# Patient Record
Sex: Female | Born: 1980 | Hispanic: No | Marital: Married | Smoking: Never smoker
Health system: Southern US, Community
[De-identification: ages and names within clinical notes are randomized; demographics above are authoritative.]

## PROBLEM LIST (undated history)

## (undated) DIAGNOSIS — O24419 Gestational diabetes mellitus in pregnancy, unspecified control: Secondary | ICD-10-CM

---

## 2011-08-17 ENCOUNTER — Ambulatory Visit
Admission: RE | Admit: 2011-08-17 | Discharge: 2011-08-17 | Disposition: A | Discharge status: Home / Self Care | Payer: No Typology Code available for payment source | Source: Ambulatory Visit | Attending: Infectious Diseases | Admitting: Infectious Diseases

## 2011-08-17 ENCOUNTER — Other Ambulatory Visit: Payer: Self-pay | Admitting: Infectious Diseases

## 2011-08-17 DIAGNOSIS — R7611 Nonspecific reaction to tuberculin skin test without active tuberculosis: Secondary | ICD-10-CM

## 2012-10-15 ENCOUNTER — Encounter: Payer: Self-pay | Admitting: Obstetrics and Gynecology

## 2012-10-15 ENCOUNTER — Ambulatory Visit (INDEPENDENT_AMBULATORY_CARE_PROVIDER_SITE_OTHER): Payer: Medicaid Other | Admitting: Obstetrics and Gynecology

## 2012-10-15 VITALS — BP 113/75 | Temp 96.8°F | Ht 60.0 in | Wt 177.2 lb

## 2012-10-15 DIAGNOSIS — O0932 Supervision of pregnancy with insufficient antenatal care, second trimester: Secondary | ICD-10-CM

## 2012-10-15 DIAGNOSIS — O0930 Supervision of pregnancy with insufficient antenatal care, unspecified trimester: Secondary | ICD-10-CM

## 2012-10-15 LAB — POCT URINALYSIS DIP (DEVICE)
Glucose, UA: NEGATIVE mg/dL
Ketones, ur: NEGATIVE mg/dL
Specific Gravity, Urine: >=1.03 (ref 1.005–1.030)

## 2012-10-15 MED ORDER — PRENATAL VITAMINS 0.8 MG PO TABS: 1.0000 | ORAL_TABLET | Freq: Every day | ORAL | Status: DC

## 2012-10-15 NOTE — Addendum Note (Signed)
Addended by: Soyla Murphy T on: 10/15/2012 04:41 PM   Modules accepted: Orders

## 2012-10-15 NOTE — Progress Notes (Signed)
   Subjective:    Mariadelaluz Syniah Berne is a [id number] [redacted]w[redacted]d being seen today for her first obstetrical visit. Language line for interpretation [place]).  Her obstetrical history is significant for excessive weight gain and late care. Patient does intend to breast feed. Pregnancy history fully reviewed.  Patient reports no complaints.  Filed Vitals:   10/15/12 0851 10/15/12 0857  BP: 113/75   Temp: 96.8 F (36 C)   Height:  5' (1.524 m)  Weight: 177 lb 3.2 oz (80.377 kg)     HISTORY: OB History   Grav Para Term Preterm Abortions TAB SAB Ect Mult Living   6 4 4  0 1 0 1 0 0 4     # Outc Date GA Lbr Len/2nd Wgt Sex Del Anes PTL Lv   1 TRM 9/97    F SVD   Yes   2 TRM 8/02    M SVD      3 TRM 12/06    F SVD      4 TRM 8/08    F SVD      5 SAB            6 CUR              History reviewed. No pertinent past medical history. History reviewed. No pertinent past surgical history. History reviewed. No pertinent family history.   Exam    Uterus:     Pelvic Exam:    Perineum: Normal Perineum   Vulva: normal, Bartholin's, Urethra, Skene's normal   Vagina:  normal mucosa, normal discharge       Cervix: multiparous appearance   Adnexa: not evaluated   Bony Pelvis: gynecoid  System: Breast:  normal appearance, no masses or tenderness, Inspection negative   Skin: normal coloration and turgor, no rashes    Neurologic: oriented, normal, grossly non-focal   Extremities: normal strength, tone, and muscle mass, no deformities   HEENT PERRLA   Mouth/Teeth mucous membranes moist, pharynx normal without lesions   Neck supple and no masses   Cardiovascular: regular rate and rhythm, no murmurs or gallops   Respiratory:  appears well, vitals normal, no respiratory distress, acyanotic, normal RR, ear and throat exam is normal, neck free of mass or lymphadenopathy, chest clear, no wheezing, crepitations, rhonchi, normal symmetric air entry   Abdomen: S=D, NT FHR 150   Urinary: urethral  meatus normal      Assessment:    Pregnancy: [id number] Patient Active Problem List   Diagnosis Date Noted  . Prenatal care insufficient 10/15/2012        Plan:     Initial labs drawn. 1 hr OGTT done Prenatal vitamins. Problem list reviewed and updated. Genetic Screening discussed Quad Screen: requested.  Ultrasound discussed; fetal survey: requested.  Follow up in 4 weeks. 50% of 30 min visit spent on counseling and coordination of care.     Shanan Fitzpatrick 10/15/2012

## 2012-10-15 NOTE — Addendum Note (Signed)
Addended by: Toula Moos on: 10/15/2012 10:36 AM   Modules accepted: Orders

## 2012-10-15 NOTE — Addendum Note (Signed)
Addended by: Soyla Murphy T on: 10/15/2012 01:36 PM   Modules accepted: Orders

## 2012-10-15 NOTE — Patient Instructions (Addendum)
Pregnancy - Second Trimester The second trimester of pregnancy (3 to 6 months) is a period of rapid growth for you and your baby. At the end of the sixth month, your baby is about 9 inches long and weighs 1 1/2 pounds. You will begin to feel the baby move between 18 and 20 weeks of the pregnancy. This is called quickening. Weight gain is faster. A clear fluid (colostrum) may leak out of your breasts. You may feel small contractions of the womb (uterus). This is known as false labor or Braxton-Hicks contractions. This is like a practice for labor when the baby is ready to be born. Usually, the problems with morning sickness have usually passed by the end of your first trimester. Some women develop small dark blotches (called cholasma, mask of pregnancy) on their face that usually goes away after the baby is born. Exposure to the sun makes the blotches worse. Acne may also develop in some pregnant women and pregnant women who have acne, may find that it goes away. PRENATAL EXAMS  Blood work may continue to be done during prenatal exams. These tests are done to check on your health and the probable health of your baby. Blood work is used to follow your blood levels (hemoglobin). Anemia (low hemoglobin) is common during pregnancy. Iron and vitamins are given to help prevent this. You will also be checked for diabetes between 24 and 28 weeks of the pregnancy. Some of the previous blood tests may be repeated.  The size of the uterus is measured during each visit. This is to make sure that the baby is continuing to grow properly according to the dates of the pregnancy.  Your blood pressure is checked every prenatal visit. This is to make sure you are not getting toxemia.  Your urine is checked to make sure you do not have an infection, diabetes or protein in the urine.  Your weight is checked often to make sure gains are happening at the suggested rate. This is to ensure that both you and your baby are growing  normally.  Sometimes, an ultrasound is performed to confirm the proper growth and development of the baby. This is a test which bounces harmless sound waves off the baby so your caregiver can more accurately determine due dates. Sometimes, a specialized test is done on the amniotic fluid surrounding the baby. This test is called an amniocentesis. The amniotic fluid is obtained by sticking a needle into the belly (abdomen). This is done to check the chromosomes in instances where there is a concern about possible genetic problems with the baby. It is also sometimes done near the end of pregnancy if an early delivery is required. In this case, it is done to help make sure the baby's lungs are mature enough for the baby to live outside of the womb. CHANGES OCCURING IN THE SECOND TRIMESTER OF PREGNANCY Your body goes through many changes during pregnancy. They vary from person to person. Talk to your caregiver about changes you notice that you are concerned about.  During the second trimester, you will likely have an increase in your appetite. It is normal to have cravings for certain foods. This varies from person to person and pregnancy to pregnancy.  Your lower abdomen will begin to bulge.  You may have to urinate more often because the uterus and baby are pressing on your bladder. It is also common to get more bladder infections during pregnancy (pain with urination). You can help this by   drinking lots of fluids and emptying your bladder before and after intercourse.  You may begin to get stretch marks on your hips, abdomen, and breasts. These are normal changes in the body during pregnancy. There are no exercises or medications to take that prevent this change.  You may begin to develop swollen and bulging veins (varicose veins) in your legs. Wearing support hose, elevating your feet for 15 minutes, 3 to 4 times a day and limiting salt in your diet helps lessen the problem.  Heartburn may develop  as the uterus grows and pushes up against the stomach. Antacids recommended by your caregiver helps with this problem. Also, eating smaller meals 4 to 5 times a day helps.  Constipation can be treated with a stool softener or adding bulk to your diet. Drinking lots of fluids, vegetables, fruits, and whole grains are helpful.  Exercising is also helpful. If you have been very active up until your pregnancy, most of these activities can be continued during your pregnancy. If you have been less active, it is helpful to start an exercise program such as walking.  Hemorrhoids (varicose veins in the rectum) may develop at the end of the second trimester. Warm sitz baths and hemorrhoid cream recommended by your caregiver helps hemorrhoid problems.  Backaches may develop during this time of your pregnancy. Avoid heavy lifting, wear low heal shoes and practice good posture to help with backache problems.  Some pregnant women develop tingling and numbness of their hand and fingers because of swelling and tightening of ligaments in the wrist (carpel tunnel syndrome). This goes away after the baby is born.  As your breasts enlarge, you may have to get a bigger bra. Get a comfortable, cotton, support bra. Do not get a nursing bra until the last month of the pregnancy if you will be nursing the baby.  You may get a dark line from your belly button to the pubic area called the linea nigra.  You may develop rosy cheeks because of increase blood flow to the face.  You may develop spider looking lines of the face, neck, arms and chest. These go away after the baby is born. HOME CARE INSTRUCTIONS   It is extremely important to avoid all smoking, herbs, alcohol, and unprescribed drugs during your pregnancy. These chemicals affect the formation and growth of the baby. Avoid these chemicals throughout the pregnancy to ensure the delivery of a healthy infant.  Most of your home care instructions are the same as  suggested for the first trimester of your pregnancy. Keep your caregiver's appointments. Follow your caregiver's instructions regarding medication use, exercise and diet.  During pregnancy, you are providing food for you and your baby. Continue to eat regular, well-balanced meals. Choose foods such as meat, fish, milk and other low fat dairy products, vegetables, fruits, and whole-grain breads and cereals. Your caregiver will tell you of the ideal weight gain.  A physical sexual relationship may be continued up until near the end of pregnancy if there are no other problems. Problems could include early (premature) leaking of amniotic fluid from the membranes, vaginal bleeding, abdominal pain, or other medical or pregnancy problems.  Exercise regularly if there are no restrictions. Check with your caregiver if you are unsure of the safety of some of your exercises. The greatest weight gain will occur in the last 2 trimesters of pregnancy. Exercise will help you:  Control your weight.  Get you in shape for labor and delivery.  Lose weight   after you have the baby.  Wear a good support or jogging bra for breast tenderness during pregnancy. This may help if worn during sleep. Pads or tissues may be used in the bra if you are leaking colostrum.  Do not use hot tubs, steam rooms or saunas throughout the pregnancy.  Wear your seat belt at all times when driving. This protects you and your baby if you are in an accident.  Avoid raw meat, uncooked cheese, cat litter boxes and soil used by cats. These carry germs that can cause birth defects in the baby.  The second trimester is also a good time to visit your dentist for your dental health if this has not been done yet. Getting your teeth cleaned is OK. Use a soft toothbrush. Brush gently during pregnancy.  It is easier to loose urine during pregnancy. Tightening up and strengthening the pelvic muscles will help with this problem. Practice stopping your  urination while you are going to the bathroom. These are the same muscles you need to strengthen. It is also the muscles you would use as if you were trying to stop from passing gas. You can practice tightening these muscles up 10 times a set and repeating this about 3 times per day. Once you know what muscles to tighten up, do not perform these exercises during urination. It is more likely to contribute to an infection by backing up the urine.  Ask for help if you have financial, counseling or nutritional needs during pregnancy. Your caregiver will be able to offer counseling for these needs as well as refer you for other special needs.  Your skin may become oily. If so, wash your face with mild soap, use non-greasy moisturizer and oil or cream based makeup. MEDICATIONS AND DRUG USE IN PREGNANCY  Take prenatal vitamins as directed. The vitamin should contain 1 milligram of folic acid. Keep all vitamins out of reach of children. Only a couple vitamins or tablets containing iron may be fatal to a baby or young child when ingested.  Avoid use of all medications, including herbs, over-the-counter medications, not prescribed or suggested by your caregiver. Only take over-the-counter or prescription medicines for pain, discomfort, or fever as directed by your caregiver. Do not use aspirin.  Let your caregiver also know about herbs you may be using.  Alcohol is related to a number of birth defects. This includes fetal alcohol syndrome. All alcohol, in any form, should be avoided completely. Smoking will cause low birth rate and premature babies.  Street or illegal drugs are very harmful to the baby. They are absolutely forbidden. A baby born to an addicted mother will be addicted at birth. The baby will go through the same withdrawal an adult does. SEEK MEDICAL CARE IF:  You have any concerns or worries during your pregnancy. It is better to call with your questions if you feel they cannot wait, rather  than worry about them. SEEK IMMEDIATE MEDICAL CARE IF:   An unexplained oral temperature above 102 F (38.9 C) develops, or as your caregiver suggests.  You have leaking of fluid from the vagina (birth canal). If leaking membranes are suspected, take your temperature and tell your caregiver of this when you call.  There is vaginal spotting, bleeding, or passing clots. Tell your caregiver of the amount and how many pads are used. Light spotting in pregnancy is common, especially following intercourse.  You develop a bad smelling vaginal discharge with a change in the color from clear   to white.  You continue to feel sick to your stomach (nauseated) and have no relief from remedies suggested. You vomit blood or coffee ground-like materials.  You lose more than 2 pounds of weight or gain more than 2 pounds of weight over 1 week, or as suggested by your caregiver.  You notice swelling of your face, hands, feet, or legs.  You get exposed to German measles and have never had them.  You are exposed to fifth disease or chickenpox.  You develop belly (abdominal) pain. Round ligament discomfort is a common non-cancerous (benign) cause of abdominal pain in pregnancy. Your caregiver still must evaluate you.  You develop a bad headache that does not go away.  You develop fever, diarrhea, pain with urination, or shortness of breath.  You develop visual problems, blurry, or double vision.  You fall or are in a car accident or any kind of trauma.  There is mental or physical violence at home. Document Released: 05/15/2001 Document Revised: 08/13/2011 Document Reviewed: 11/17/2008 [employer] Patient Information 2013 [employer].  

## 2012-10-15 NOTE — Progress Notes (Signed)
Pulse- 91 Patient reports pelvic pain/pressure  

## 2012-10-16 ENCOUNTER — Telehealth: Payer: Self-pay

## 2012-10-16 LAB — GLUCOSE TOLERANCE, 1 HOUR (50G) W/O FASTING: Glucose, 1 Hour GTT: 101 mg/dL (ref 70–140)

## 2012-10-16 LAB — CULTURE, OB URINE

## 2012-10-16 MED ORDER — PRENATAL VITAMINS 0.8 MG PO TABS: 1.0000 | ORAL_TABLET | Freq: Every day | ORAL | Status: DC

## 2012-10-16 NOTE — Addendum Note (Signed)
Addended by: Caren Griffins C on: 10/16/2012 11:06 AM   Modules accepted: Orders

## 2012-10-16 NOTE — Telephone Encounter (Signed)
Used pacific interpreter to speak to patient.  She left her RX for prenatal vitamins at the clinic yesterday.  We were unable to e-prescribe because she did not have a pharmacy on file.  I contacted the patient and she was unsure of which pharmacy to use so she stated she would come to the clinic tomorrow to pick up her RX.

## 2012-10-17 ENCOUNTER — Other Ambulatory Visit: Payer: Self-pay

## 2012-10-17 DIAGNOSIS — O0932 Supervision of pregnancy with insufficient antenatal care, second trimester: Secondary | ICD-10-CM

## 2012-10-17 LAB — OBSTETRIC PANEL
Antibody Screen: NEGATIVE
Basophils Absolute: 0 10*3/uL (ref 0.0–0.1)
Basophils Relative: 0 % (ref 0–1)
Eosinophils Absolute: 0.4 10*3/uL (ref 0.0–0.7)
Hemoglobin: 12 g/dL (ref 12.0–15.0)
Hepatitis B Surface Ag: NEGATIVE
MCH: 28.7 pg (ref 26.0–34.0)
MCHC: 35.4 g/dL (ref 30.0–36.0)
Monocytes Relative: 5 % (ref 3–12)
Neutrophils Relative %: 66 % (ref 43–77)
Platelets: 177 10*3/uL (ref 150–400)
RDW: 14.7 % (ref 11.5–15.5)
Rh Type: POSITIVE

## 2012-10-17 LAB — HEMOGLOBINOPATHY EVALUATION
Hemoglobin Other: 0 %
Hgb A2 Quant: 3 % (ref 2.2–3.2)
Hgb A: 96.7 % — ABNORMAL LOW (ref 96.8–97.8)

## 2012-10-17 MED ORDER — PRENATAL VITAMINS 0.8 MG PO TABS: 1.0000 | ORAL_TABLET | Freq: Every day | ORAL | Status: DC

## 2012-10-23 ENCOUNTER — Encounter: Payer: Self-pay | Admitting: *Deleted

## 2012-10-29 ENCOUNTER — Ambulatory Visit (HOSPITAL_COMMUNITY): Admission: RE | Admit: 2012-10-29 | Payer: Self-pay | Source: Ambulatory Visit

## 2012-11-07 ENCOUNTER — Ambulatory Visit (HOSPITAL_COMMUNITY)
Admission: RE | Admit: 2012-11-07 | Discharge: 2012-11-07 | Disposition: A | Discharge status: Home / Self Care | Payer: Medicaid Other | Source: Ambulatory Visit | Attending: Obstetrics and Gynecology | Admitting: Obstetrics and Gynecology

## 2012-11-07 DIAGNOSIS — O0930 Supervision of pregnancy with insufficient antenatal care, unspecified trimester: Secondary | ICD-10-CM | POA: Insufficient documentation

## 2012-11-07 DIAGNOSIS — Z3689 Encounter for other specified antenatal screening: Secondary | ICD-10-CM | POA: Insufficient documentation

## 2012-11-07 DIAGNOSIS — O0932 Supervision of pregnancy with insufficient antenatal care, second trimester: Secondary | ICD-10-CM

## 2012-11-12 ENCOUNTER — Ambulatory Visit (INDEPENDENT_AMBULATORY_CARE_PROVIDER_SITE_OTHER): Payer: Medicaid Other | Admitting: Obstetrics and Gynecology

## 2012-11-12 ENCOUNTER — Encounter: Payer: Self-pay | Admitting: Obstetrics and Gynecology

## 2012-11-12 VITALS — BP 121/77 | Temp 96.9°F | Wt 178.4 lb

## 2012-11-12 DIAGNOSIS — IMO0002 Reserved for concepts with insufficient information to code with codable children: Secondary | ICD-10-CM

## 2012-11-12 DIAGNOSIS — O1202 Gestational edema, second trimester: Secondary | ICD-10-CM | POA: Insufficient documentation

## 2012-11-12 DIAGNOSIS — O0930 Supervision of pregnancy with insufficient antenatal care, unspecified trimester: Secondary | ICD-10-CM

## 2012-11-12 LAB — POCT URINALYSIS DIP (DEVICE)
Ketones, ur: NEGATIVE mg/dL
Leukocytes, UA: NEGATIVE
Nitrite: NEGATIVE
Protein, ur: 30 mg/dL — AB

## 2012-11-12 NOTE — Progress Notes (Signed)
Reviewed SVDs- largest 3k. [place] all nl. Trace pedal and ankle edema and measures to alleviate. RLP discussed.

## 2012-11-12 NOTE — Progress Notes (Signed)
Pulse- 100  Edema-feet

## 2012-11-12 NOTE — Patient Instructions (Addendum)
Peripheral Edema You have swelling in your legs (peripheral edema). This swelling is due to excess accumulation of salt and water in your body. Edema may be a sign of heart, kidney or liver disease, or a side effect of a medication. It may also be due to problems in the leg veins. Elevating your legs and using special support stockings may be very helpful, if the cause of the swelling is due to poor venous circulation. Avoid long periods of standing, whatever the cause. Treatment of edema depends on identifying the cause. Chips, pretzels, pickles and other salty foods should be avoided. Restricting salt in your diet is almost always needed. Water pills (diuretics) are often used to remove the excess salt and water from your body via urine. These medicines prevent the kidney from reabsorbing sodium. This increases urine flow. Diuretic treatment may also result in lowering of potassium levels in your body. Potassium supplements may be needed if you have to use diuretics daily. Daily weights can help you keep track of your progress in clearing your edema. You should call your caregiver for follow up care as recommended. SEEK IMMEDIATE MEDICAL CARE IF:   You have increased swelling, pain, redness, or heat in your legs.  You develop shortness of breath, especially when lying down.  You develop chest or abdominal pain, weakness, or fainting.  You have a fever. Document Released: 06/28/2004 Document Revised: 08/13/2011 Document Reviewed: 06/08/2009 [facility] Patient Information 2014 [place], [place]. Round Ligament Pain The round ligament is made up of muscle and fibrous tissue. It is attached to the uterus near the fallopian tube. The round ligament is located on both sides of the uterus and helps support the position of the uterus. It usually begins in the second trimester of pregnancy when the uterus comes out of the pelvis. The pain can come and go until the baby is delivered. Round ligament pain is not a  serious problem and does not cause harm to the baby. CAUSE During pregnancy the uterus grows the most from the second trimester to delivery. As it grows, it stretches and slightly twists the round ligaments. When the uterus leans from one side to the other, the round ligament on the opposite side pulls and stretches. This can cause pain. SYMPTOMS  Pain can occur on one side or both sides. The pain is usually a short, sharp, and pinching-like. Sometimes it can be a dull, lingering and aching pain. The pain is located in the lower side of the abdomen or in the groin. The pain is internal and usually starts deep in the groin and moves up to the outside of the hip area. Pain can occur with:  Sudden change in position like getting out of bed or a chair.  Rolling over in bed.  Coughing or sneezing.  Walking too much.  Any type of physical activity. DIAGNOSIS  Your caregiver will make sure there are no serious problems causing the pain. When nothing serious is found, the symptoms usually indicate that the pain is from the round ligament. TREATMENT   Sit down and relax when the pain starts.  Flex your knees up to your belly.  Lay on your side with a pillow under your belly (abdomen) and another one between your legs.  Sit in a hot bath for 15 to 20 minutes or until the pain goes away. HOME CARE INSTRUCTIONS   Only take over-the-counter or prescriptions medicines for pain, discomfort or fever as directed by your caregiver.  Sit and stand  slowly.  Avoid long walks if it causes pain.  Stop or lessen your physical activities if it causes pain. SEEK MEDICAL CARE IF:   The pain does not go away with any of your treatment.  You need stronger medication for the pain.  You develop back pain that you did not have before with the side pain. SEEK IMMEDIATE MEDICAL CARE IF:   You develop a temperature of 102 F (38.9 C) or higher.  You develop uterine contractions.  You develop vaginal  bleeding.  You develop nausea, vomiting or diarrhea.  You develop chills.  You have pain when you urinate. Document Released: 02/28/2008 Document Revised: 08/13/2011 Document Reviewed: 02/28/2008 [facility] Patient Information 2014 [place], [place].

## 2012-11-19 ENCOUNTER — Ambulatory Visit (HOSPITAL_COMMUNITY)
Admission: RE | Admit: 2012-11-19 | Discharge: 2012-11-19 | Disposition: A | Discharge status: Home / Self Care | Payer: Medicaid Other | Source: Ambulatory Visit | Attending: Obstetrics and Gynecology | Admitting: Obstetrics and Gynecology

## 2012-11-19 DIAGNOSIS — O0930 Supervision of pregnancy with insufficient antenatal care, unspecified trimester: Secondary | ICD-10-CM

## 2012-11-19 DIAGNOSIS — Z3689 Encounter for other specified antenatal screening: Secondary | ICD-10-CM | POA: Insufficient documentation

## 2012-11-19 DIAGNOSIS — O1202 Gestational edema, second trimester: Secondary | ICD-10-CM

## 2012-12-10 ENCOUNTER — Ambulatory Visit (INDEPENDENT_AMBULATORY_CARE_PROVIDER_SITE_OTHER): Payer: Medicaid Other | Admitting: Advanced Practice Midwife

## 2012-12-10 VITALS — BP 121/71 | Temp 97.6°F | Wt 179.9 lb

## 2012-12-10 DIAGNOSIS — O0930 Supervision of pregnancy with insufficient antenatal care, unspecified trimester: Secondary | ICD-10-CM

## 2012-12-10 LAB — POCT URINALYSIS DIP (DEVICE)
Glucose, UA: NEGATIVE mg/dL
Hgb urine dipstick: NEGATIVE
Nitrite: NEGATIVE
Protein, ur: NEGATIVE mg/dL
Specific Gravity, Urine: >=1.03 (ref 1.005–1.030)
Urobilinogen, UA: 1 mg/dL (ref 0.0–1.0)
pH: 5.5 (ref 5.0–8.0)

## 2012-12-10 NOTE — Progress Notes (Signed)
Pulse- 108 [employer] interpreter# [phone]

## 2012-12-10 NOTE — Progress Notes (Signed)
Patient is feeling well. No complaints or questions today. [place] used. GTT at next visit.

## 2013-01-07 ENCOUNTER — Ambulatory Visit (INDEPENDENT_AMBULATORY_CARE_PROVIDER_SITE_OTHER): Payer: Medicaid Other | Admitting: Advanced Practice Midwife

## 2013-01-07 VITALS — BP 124/72 | Temp 97.4°F | Wt 182.2 lb

## 2013-01-07 DIAGNOSIS — Z23 Encounter for immunization: Secondary | ICD-10-CM

## 2013-01-07 DIAGNOSIS — O0930 Supervision of pregnancy with insufficient antenatal care, unspecified trimester: Secondary | ICD-10-CM

## 2013-01-07 DIAGNOSIS — Z3490 Encounter for supervision of normal pregnancy, unspecified, unspecified trimester: Secondary | ICD-10-CM

## 2013-01-07 DIAGNOSIS — O0932 Supervision of pregnancy with insufficient antenatal care, second trimester: Secondary | ICD-10-CM

## 2013-01-07 LAB — CBC
HCT: 34.4 % — ABNORMAL LOW (ref 36.0–46.0)
Hemoglobin: 12.2 g/dL (ref 12.0–15.0)
MCH: 28.2 pg (ref 26.0–34.0)
MCHC: 35.5 g/dL (ref 30.0–36.0)
MCV: 79.6 fL (ref 78.0–100.0)
RDW: 13.7 % (ref 11.5–15.5)

## 2013-01-07 LAB — POCT URINALYSIS DIP (DEVICE)
Hgb urine dipstick: NEGATIVE
Leukocytes, UA: NEGATIVE
Nitrite: NEGATIVE
Protein, ur: NEGATIVE mg/dL
Urobilinogen, UA: 0.2 mg/dL (ref 0.0–1.0)
pH: 6 (ref 5.0–8.0)

## 2013-01-07 MED ORDER — TETANUS-DIPHTH-ACELL PERTUSSIS 5-2.5-18.5 LF-MCG/0.5 IM SUSP
0.5000 mL | Freq: Once | INTRAMUSCULAR | Status: AC
  Administered 2013-01-07: 0.5 mL via INTRAMUSCULAR

## 2013-01-07 NOTE — Patient Instructions (Addendum)

## 2013-01-07 NOTE — Addendum Note (Signed)
Addended by: Faythe Casa on: 01/07/2013 10:56 AM   Modules accepted: Orders

## 2013-01-07 NOTE — Addendum Note (Signed)
Addended by: Aviva Signs on: 01/07/2013 12:51 PM   Modules accepted: Orders

## 2013-01-07 NOTE — Addendum Note (Signed)
Addended by: Franchot Mimes on: 01/07/2013 10:14 AM   Modules accepted: Orders

## 2013-01-07 NOTE — Progress Notes (Signed)
P=102 . Used [employer] [phone],. States yesterday felt very hungry, ate eggs, then had diarrhea, vomiting , sweating and cramping.States none today. States has never had problem when ate eggs before.

## 2013-01-07 NOTE — Progress Notes (Signed)
Interpretor used.  Feels well, but had one day with nausea and diarrhea, now gone. Has never had problem with eggs before, doubt this is an egg allergy.  Agrees to vaccine which I think is safe.  Glucola and labs today.

## 2013-01-08 LAB — GLUCOSE TOLERANCE, 1 HOUR (50G) W/O FASTING: Glucose, 1 Hour GTT: 136 mg/dL (ref 70–140)

## 2013-01-12 ENCOUNTER — Encounter: Payer: Self-pay | Admitting: *Deleted

## 2013-01-20 ENCOUNTER — Telehealth: Payer: Self-pay | Admitting: General Practice

## 2013-01-20 NOTE — Telephone Encounter (Signed)
Called patient with pacific interpreter # [phone], no answer- left message stating we are trying to get in touch with her with some information and to please come for her appt tomorrow at 8am and to not eat or drink anything after midnight.

## 2013-01-20 NOTE — Telephone Encounter (Signed)
Message copied by Kathee Delton on Tue Jan 20, 2013  3:23 PM ------      Message from: [place], [place] L      Created: Thu Jan 15, 2013 11:24 AM       Needs to do 3 hr GTT ------

## 2013-01-21 ENCOUNTER — Encounter: Payer: Self-pay | Admitting: Obstetrics and Gynecology

## 2013-01-21 ENCOUNTER — Ambulatory Visit (INDEPENDENT_AMBULATORY_CARE_PROVIDER_SITE_OTHER): Payer: Medicaid Other | Admitting: Obstetrics and Gynecology

## 2013-01-21 VITALS — BP 111/75 | Temp 97.3°F | Wt 185.1 lb

## 2013-01-21 DIAGNOSIS — O0932 Supervision of pregnancy with insufficient antenatal care, second trimester: Secondary | ICD-10-CM

## 2013-01-21 DIAGNOSIS — O99810 Abnormal glucose complicating pregnancy: Secondary | ICD-10-CM

## 2013-01-21 DIAGNOSIS — O24419 Gestational diabetes mellitus in pregnancy, unspecified control: Secondary | ICD-10-CM

## 2013-01-21 DIAGNOSIS — O0930 Supervision of pregnancy with insufficient antenatal care, unspecified trimester: Secondary | ICD-10-CM

## 2013-01-21 DIAGNOSIS — O0933 Supervision of pregnancy with insufficient antenatal care, third trimester: Secondary | ICD-10-CM

## 2013-01-21 LAB — POCT URINALYSIS DIP (DEVICE)
Bilirubin Urine: NEGATIVE
Hgb urine dipstick: NEGATIVE
Leukocytes, UA: NEGATIVE
Nitrite: NEGATIVE
Protein, ur: NEGATIVE mg/dL
pH: 5.5 (ref 5.0–8.0)

## 2013-01-21 LAB — GLUCOSE TOLERANCE, 3 HOURS
Glucose Tolerance, 2 hour: 194 mg/dL — ABNORMAL HIGH (ref 70–164)
Glucose, GTT - 3 Hour: 103 mg/dL (ref 70–144)

## 2013-01-21 MED ORDER — PRENATAL VITAMINS 0.8 MG PO TABS: 1.0000 | ORAL_TABLET | Freq: Every day | ORAL | Status: DC

## 2013-01-21 NOTE — Addendum Note (Signed)
Addended by: Franchot Mimes on: 01/21/2013 09:43 AM   Modules accepted: Orders

## 2013-01-21 NOTE — Patient Instructions (Signed)
Contraception Choices  Contraception (birth control) is the use of any methods or devices to prevent pregnancy. Below are some methods to help avoid pregnancy.  HORMONAL METHODS   · Contraceptive implant. This is a thin, plastic tube containing progesterone hormone. It does not contain estrogen hormone. Your caregiver inserts the tube in the inner part of the upper arm. The tube can remain in place for up to 3 years. After 3 years, the implant must be removed. The implant prevents the ovaries from releasing an egg (ovulation), thickens the cervical mucus which prevents sperm from entering the uterus, and thins the lining of the inside of the uterus.  · Progesterone-only injections. These injections are given every 3 months by your caregiver to prevent pregnancy. This synthetic progesterone hormone stops the ovaries from releasing eggs. It also thickens cervical mucus and changes the uterine lining. This makes it harder for sperm to survive in the uterus.  · Birth control pills. These pills contain estrogen and progesterone hormone. They work by stopping the egg from forming in the ovary (ovulation). Birth control pills are prescribed by a caregiver. Birth control pills can also be used to treat heavy periods.  · Minipill. This type of birth control pill contains only the progesterone hormone. They are taken every day of each month and must be prescribed by your caregiver.  · Birth control patch. The patch contains hormones similar to those in birth control pills. It must be changed once a week and is prescribed by a caregiver.  · Vaginal ring. The ring contains hormones similar to those in birth control pills. It is left in the vagina for 3 weeks, removed for 1 week, and then a new one is put back in place. The patient must be comfortable inserting and removing the ring from the vagina. A caregiver's prescription is necessary.  · Emergency contraception. Emergency contraceptives prevent pregnancy after unprotected  sexual intercourse. This pill can be taken right after sex or up to 5 days after unprotected sex. It is most effective the sooner you take the pills after having sexual intercourse. Emergency contraceptive pills are available without a prescription. Check with your pharmacist. Do not use emergency contraception as your only form of birth control.  BARRIER METHODS   · Female condom. This is a thin sheath (latex or rubber) that is worn over the penis during sexual intercourse. It can be used with spermicide to increase effectiveness.  · Female condom. This is a soft, loose-fitting sheath that is put into the vagina before sexual intercourse.  · Diaphragm. This is a soft, latex, dome-shaped barrier that must be fitted by a caregiver. It is inserted into the vagina, along with a spermicidal jelly. It is inserted before intercourse. The diaphragm should be left in the vagina for 6 to 8 hours after intercourse.  · Cervical cap. This is a round, soft, latex or plastic cup that fits over the cervix and must be fitted by a caregiver. The cap can be left in place for up to 48 hours after intercourse.  · Sponge. This is a soft, circular piece of polyurethane foam. The sponge has spermicide in it. It is inserted into the vagina after wetting it and before sexual intercourse.  · Spermicides. These are chemicals that kill or block sperm from entering the cervix and uterus. They come in the form of creams, jellies, suppositories, foam, or tablets. They do not require a prescription. They are inserted into the vagina with an applicator before having sexual intercourse.   IUD). This is a T-shaped device that is put in a woman's uterus during a menstrual period to prevent pregnancy. There are 2 types:  Copper IUD. This type of IUD is wrapped in copper wire and is placed inside the uterus. Copper makes the uterus and  fallopian tubes produce a fluid that kills sperm. It can stay in place for 10 years.  Hormone IUD. This type of IUD contains the hormone progestin (synthetic progesterone). The hormone thickens the cervical mucus and prevents sperm from entering the uterus, and it also thins the uterine lining to prevent implantation of a fertilized egg. The hormone can weaken or kill the sperm that get into the uterus. It can stay in place for 5 years. PERMANENT METHODS OF CONTRACEPTION  Female tubal ligation. This is when the woman's fallopian tubes are surgically sealed, tied, or blocked to prevent the egg from traveling to the uterus.  Female sterilization. This is when the female has the tubes that carry sperm tied off (vasectomy).This blocks sperm from entering the vagina during sexual intercourse. After the procedure, the man can still ejaculate fluid (semen). NATURAL PLANNING METHODS  Natural family planning. This is not having sexual intercourse or using a barrier method (condom, diaphragm, cervical cap) on days the woman could become pregnant.  Calendar method. This is keeping track of the length of each menstrual cycle and identifying when you are fertile.  Ovulation method. This is avoiding sexual intercourse during ovulation.  Symptothermal method. This is avoiding sexual intercourse during ovulation, using a thermometer and ovulation symptoms.  Post-ovulation method. This is timing sexual intercourse after you have ovulated. Regardless of which type or method of contraception you choose, it is important that you use condoms to protect against the transmission of sexually transmitted diseases (STDs). Talk with your caregiver about which form of contraception is most appropriate for you. Document Released: 05/21/2005 Document Revised: 08/13/2011 Document Reviewed: 09/27/2010 [facility] Patient Information 2014 [place], [place]. Heartburn Heartburn is a painful, burning sensation in the chest. It may feel  worse in certain positions, such as lying down or bending over. It is caused by stomach acid backing up into the tube that carries food from the mouth down to the stomach (lower esophagus).  CAUSES   Large meals.  Certain foods and drinks.  Exercise.  Increased acid production.  Being overweight or obese.  Certain medicines. SYMPTOMS   Burning pain in the chest or lower throat.  Bitter taste in the mouth.  Coughing. DIAGNOSIS  If the usual treatments for heartburn do not improve your symptoms, then tests may be done to see if there is another condition present. Possible tests may include:  X-rays.  Endoscopy. This is when a tube with a light and a camera on the end is used to examine the esophagus and the stomach.  A test to measure the amount of acid in the esophagus (pH test).  A test to see if the esophagus is working properly (esophageal manometry).  Blood, breath, or stool tests to check for bacteria that cause ulcers. TREATMENT   Your caregiver may tell you to use certain over-the-counter medicines (antacids, acid reducers) for mild heartburn.  Your caregiver may prescribe medicines to decrease the acid in your stomach or protect your stomach lining.  Your caregiver may recommend certain diet changes.  For severe cases, your caregiver may recommend that the head of your bed be elevated on blocks. (Sleeping with more pillows is not an effective treatment as it only changes the  position of your head and does not improve the main problem of stomach acid refluxing into the esophagus.) HOME CARE INSTRUCTIONS   Take all medicines as directed by your caregiver.  Raise the head of your bed by putting blocks under the legs if instructed to by your caregiver.  Do not exercise right after eating.  Avoid eating 2 or 3 hours before bed. Do not lie down right after eating.  Eat small meals throughout the day instead of 3 large meals.  Stop smoking if you  smoke.  Maintain a healthy weight.  Identify foods and beverages that make your symptoms worse and avoid them. Foods you may want to avoid include:  Peppers.  Chocolate.  High-fat foods, including fried foods.  Spicy foods.  Garlic and onions.  Citrus fruits, including oranges, grapefruit, lemons, and limes.  Food containing tomatoes or tomato products.  Mint.  Carbonated drinks, caffeinated drinks, and alcohol.  Vinegar. SEEK IMMEDIATE MEDICAL CARE IF:  You have severe chest pain that goes down your arm or into your jaw or neck.  You feel sweaty, dizzy, or lightheaded.  You are short of breath.  You vomit blood.  You have difficulty or pain with swallowing.  You have bloody or black, tarry stools.  You have episodes of heartburn more than 3 times a week for more than 2 weeks. MAKE SURE YOU:  Understand these instructions.  Will watch your condition.  Will get help right away if you are not doing well or get worse. Document Released: 10/07/2008 Document Revised: 08/13/2011 Document Reviewed: 11/05/2010 [facility] Patient Information 2014 [place], [place].

## 2013-01-21 NOTE — Progress Notes (Signed)
PNV refilled. Discussed plans and info on contraception, circumcision given. 3 hr OGTT today. UA normal except SG 1.030. "Crqamping" is epigastric burning after eating. Denies UCs. Abd NT. Murphy's neg. Heartburn measures reviewed.

## 2013-01-21 NOTE — Progress Notes (Signed)
Pulse- 104  Patient needs refill on PNV Patient still reporting cramping like her period, nausea and vomiting with eating, states she gets really sweaty- reports constipation

## 2013-01-22 DIAGNOSIS — O24419 Gestational diabetes mellitus in pregnancy, unspecified control: Secondary | ICD-10-CM | POA: Insufficient documentation

## 2013-01-30 ENCOUNTER — Telehealth: Payer: Self-pay | Admitting: *Deleted

## 2013-01-30 NOTE — Telephone Encounter (Signed)
Message copied by Gerome Apley on Fri Jan 30, 2013 12:03 PM ------      Message from: POE, DEIRDRE C      Created: Thu Jan 22, 2013  8:30 AM       Pleasse schedule for Monday for GDM new dx ------

## 2013-02-04 ENCOUNTER — Ambulatory Visit (INDEPENDENT_AMBULATORY_CARE_PROVIDER_SITE_OTHER): Payer: Medicaid Other | Admitting: Advanced Practice Midwife

## 2013-02-04 VITALS — BP 114/73 | Temp 97.0°F | Wt 188.2 lb

## 2013-02-04 DIAGNOSIS — O99810 Abnormal glucose complicating pregnancy: Secondary | ICD-10-CM

## 2013-02-04 DIAGNOSIS — O24419 Gestational diabetes mellitus in pregnancy, unspecified control: Secondary | ICD-10-CM

## 2013-02-04 LAB — POCT URINALYSIS DIP (DEVICE)
Bilirubin Urine: NEGATIVE
Ketones, ur: NEGATIVE mg/dL
Leukocytes, UA: NEGATIVE
Specific Gravity, Urine: >=1.03 (ref 1.005–1.030)
pH: 5.5 (ref 5.0–8.0)

## 2013-02-04 MED ORDER — ACCU-CHEK NANO SMARTVIEW W/DEVICE KIT: 1.0000 | PACK | Status: DC

## 2013-02-04 MED ORDER — GLUCOSE BLOOD VI STRP: ORAL_STRIP | Status: DC

## 2013-02-04 MED ORDER — ACCU-CHEK FASTCLIX LANCETS MISC: 1.0000 | Freq: Four times a day (QID) | Status: DC

## 2013-02-04 NOTE — Progress Notes (Signed)
Doing well.  Good fetal movement, denies vaginal bleeding, LOF, regular contractions.  Using language line, discussed new diagnosis of gestational diabetes, need for diabetic education. Pt states understanding.  She will come in to see nutritionist this Thursday and bring new meter (prescription given to pt) to appointment Monday to see diabetic educator.

## 2013-02-04 NOTE — Progress Notes (Signed)
P=104, Used [employer] [phone], is not doing cbg's. Needs meter.c/o feeling tired.

## 2013-02-05 ENCOUNTER — Ambulatory Visit: Payer: Medicaid Other | Admitting: *Deleted

## 2013-02-05 VITALS — Wt 187.1 lb

## 2013-02-05 DIAGNOSIS — O99810 Abnormal glucose complicating pregnancy: Secondary | ICD-10-CM

## 2013-02-05 NOTE — Progress Notes (Signed)
Nutrition note: GDM diet education Pt is a newly diagnosed GDM pt and has h/o obesity. Pt has gained 34.1# @ [redacted]w[redacted]d, which is > expected. Used [employer] [phone] for Amharic. Pt reports eating 5x/d and drinks mostly water and milk with juice occ. Pt is taking PNV. Pt reports nausea occ. NKFA. Pt is walking occ. Pt received verbal & written education on GDM diet.  Discussed wt gain goals of 11-20# or 0.5#/wk. Pt agrees to follow GDM diet with 3 meals & 3 snacks/d with proper CHO/ protein combination.  Pt has WIC and plans to BF. F/u 02/09/13 when here to see DM educator about how to use meter Blondell Reveal, MS, RD, LDN

## 2013-02-09 ENCOUNTER — Ambulatory Visit (INDEPENDENT_AMBULATORY_CARE_PROVIDER_SITE_OTHER): Payer: Medicaid Other | Admitting: Obstetrics & Gynecology

## 2013-02-09 ENCOUNTER — Encounter: Payer: Medicaid Other | Attending: Obstetrics and Gynecology | Admitting: *Deleted

## 2013-02-09 VITALS — BP 111/71 | Temp 98.1°F | Wt 187.8 lb

## 2013-02-09 DIAGNOSIS — O99810 Abnormal glucose complicating pregnancy: Secondary | ICD-10-CM | POA: Insufficient documentation

## 2013-02-09 DIAGNOSIS — Z713 Dietary counseling and surveillance: Secondary | ICD-10-CM | POA: Insufficient documentation

## 2013-02-09 DIAGNOSIS — O24419 Gestational diabetes mellitus in pregnancy, unspecified control: Secondary | ICD-10-CM

## 2013-02-09 DIAGNOSIS — Z758 Other problems related to medical facilities and other health care: Secondary | ICD-10-CM | POA: Insufficient documentation

## 2013-02-09 DIAGNOSIS — O0930 Supervision of pregnancy with insufficient antenatal care, unspecified trimester: Secondary | ICD-10-CM

## 2013-02-09 DIAGNOSIS — Z609 Problem related to social environment, unspecified: Secondary | ICD-10-CM

## 2013-02-09 DIAGNOSIS — O3663X1 Maternal care for excessive fetal growth, third trimester, fetus 1: Secondary | ICD-10-CM

## 2013-02-09 DIAGNOSIS — O3660X0 Maternal care for excessive fetal growth, unspecified trimester, not applicable or unspecified: Secondary | ICD-10-CM

## 2013-02-09 DIAGNOSIS — O0933 Supervision of pregnancy with insufficient antenatal care, third trimester: Secondary | ICD-10-CM

## 2013-02-09 DIAGNOSIS — Z789 Other specified health status: Secondary | ICD-10-CM | POA: Insufficient documentation

## 2013-02-09 LAB — POCT URINALYSIS DIP (DEVICE)
Glucose, UA: NEGATIVE mg/dL
Hgb urine dipstick: NEGATIVE
Ketones, ur: NEGATIVE mg/dL
Specific Gravity, Urine: >=1.03 (ref 1.005–1.030)

## 2013-02-09 NOTE — Progress Notes (Signed)
Amharic phone interpreter used.  Not checking CBGs yet, will get GDM education today, evaluate in one week. Large for dates, new diagnosis of GDM, ultrasound ordered.   No other complaints or concerns.  Fetal movement and labor precautions reviewed.

## 2013-02-09 NOTE — Progress Notes (Signed)
Pulse- 102 [employer] interpreter # [phone]

## 2013-02-09 NOTE — Progress Notes (Signed)
Requested to instruct patient on how to use her glucose meter.  She has the accucheck nano that she purchased from drug store).  Instructed pt on how to load the cartridge fastclix into the lancet device and how to use it to get a blood sample. Explained necessity of keeping the strips in the container with top on tight.  Instructed how to stick finger using cleansing technique with either soap and water or alcohol, then how to insert the strip and draw up the blood.  Pt demonstrated technique properly, all using a phone to phone interpreter. RD reviewed meal plan, snacks, and monitoring schedule. Instructed to write her glucose results down in log book. Thank you, Lenor Coffin, RN, Diabetes CNS [phone])

## 2013-02-09 NOTE — Progress Notes (Signed)
Nutrition note: f/u re GDM diet Pt returned to learn how to check BS. Reviewed diet with pt.  Pt stated she has been trying to follow GDM diet and had no questions. F/u in 2-4 wks Blondell Reveal, MS, RD, LDN

## 2013-02-09 NOTE — Progress Notes (Signed)
U/S scheduled 02/11/13 at 1015 am.

## 2013-02-11 ENCOUNTER — Ambulatory Visit (HOSPITAL_COMMUNITY)
Admission: RE | Admit: 2013-02-11 | Discharge: 2013-02-11 | Disposition: A | Discharge status: Home / Self Care | Payer: Medicaid Other | Source: Ambulatory Visit | Attending: Obstetrics & Gynecology | Admitting: Obstetrics & Gynecology

## 2013-02-11 ENCOUNTER — Other Ambulatory Visit: Payer: Self-pay | Admitting: Obstetrics & Gynecology

## 2013-02-11 DIAGNOSIS — O3663X1 Maternal care for excessive fetal growth, third trimester, fetus 1: Secondary | ICD-10-CM

## 2013-02-11 DIAGNOSIS — O99810 Abnormal glucose complicating pregnancy: Secondary | ICD-10-CM | POA: Insufficient documentation

## 2013-02-11 DIAGNOSIS — O24419 Gestational diabetes mellitus in pregnancy, unspecified control: Secondary | ICD-10-CM

## 2013-02-11 DIAGNOSIS — O0930 Supervision of pregnancy with insufficient antenatal care, unspecified trimester: Secondary | ICD-10-CM | POA: Insufficient documentation

## 2013-02-11 DIAGNOSIS — O3660X0 Maternal care for excessive fetal growth, unspecified trimester, not applicable or unspecified: Secondary | ICD-10-CM | POA: Insufficient documentation

## 2013-02-12 ENCOUNTER — Encounter: Payer: Self-pay | Admitting: Obstetrics & Gynecology

## 2013-02-12 DIAGNOSIS — O3660X0 Maternal care for excessive fetal growth, unspecified trimester, not applicable or unspecified: Secondary | ICD-10-CM | POA: Insufficient documentation

## 2013-02-16 ENCOUNTER — Ambulatory Visit (INDEPENDENT_AMBULATORY_CARE_PROVIDER_SITE_OTHER): Payer: Medicaid Other | Admitting: Obstetrics & Gynecology

## 2013-02-16 VITALS — BP 107/67 | Temp 97.7°F | Wt 184.8 lb

## 2013-02-16 DIAGNOSIS — O3660X0 Maternal care for excessive fetal growth, unspecified trimester, not applicable or unspecified: Secondary | ICD-10-CM

## 2013-02-16 DIAGNOSIS — O99810 Abnormal glucose complicating pregnancy: Secondary | ICD-10-CM

## 2013-02-16 DIAGNOSIS — Z789 Other specified health status: Secondary | ICD-10-CM

## 2013-02-16 DIAGNOSIS — Z609 Problem related to social environment, unspecified: Secondary | ICD-10-CM

## 2013-02-16 LAB — POCT URINALYSIS DIP (DEVICE)
Glucose, UA: NEGATIVE mg/dL
Hgb urine dipstick: NEGATIVE
Ketones, ur: NEGATIVE mg/dL
Specific Gravity, Urine: >=1.03 (ref 1.005–1.030)

## 2013-02-16 LAB — GLUCOSE, CAPILLARY: Glucose-Capillary: 81 mg/dL (ref 70–99)

## 2013-02-16 NOTE — Progress Notes (Signed)
Fasting--pt not checking.  Pt not checking CBGS consistently.  Pt did not understand how to record CBGs.  Spent a lot of time with phone interpreter to explain when to check and where to write the value.  Today CBG is 81.  Pt ate 5 1/2 hours ago.   Overall, CBGs are not high so under reasonable control with diet.  Pt to record CBGs and return in one week.

## 2013-02-16 NOTE — Progress Notes (Signed)
Pulse- 98 [employer] interpreter # [phone]

## 2013-02-17 ENCOUNTER — Encounter: Payer: Self-pay | Admitting: *Deleted

## 2013-02-23 ENCOUNTER — Ambulatory Visit (INDEPENDENT_AMBULATORY_CARE_PROVIDER_SITE_OTHER): Payer: Medicaid Other | Admitting: Obstetrics & Gynecology

## 2013-02-23 VITALS — BP 126/73 | Temp 97.6°F | Wt 187.5 lb

## 2013-02-23 DIAGNOSIS — O0930 Supervision of pregnancy with insufficient antenatal care, unspecified trimester: Secondary | ICD-10-CM

## 2013-02-23 DIAGNOSIS — O0933 Supervision of pregnancy with insufficient antenatal care, third trimester: Secondary | ICD-10-CM

## 2013-02-23 LAB — POCT URINALYSIS DIP (DEVICE)
Bilirubin Urine: NEGATIVE
Glucose, UA: NEGATIVE mg/dL
Hgb urine dipstick: NEGATIVE
Ketones, ur: NEGATIVE mg/dL
Specific Gravity, Urine: >=1.03 (ref 1.005–1.030)

## 2013-02-23 MED ORDER — PRENATAL VITAMINS PLUS 27-1 MG PO TABS: 1.0000 | ORAL_TABLET | Freq: Every day | ORAL | Status: DC

## 2013-02-23 NOTE — Progress Notes (Signed)
FBS 76-105, most <100. Good FM.

## 2013-02-23 NOTE — Patient Instructions (Signed)
Pregnancy - Third Trimester  The third trimester begins at the 28th week of pregnancy and ends at birth. It is important to follow your doctor's instructions.  HOME CARE    Go to your doctor's visits.   Do not smoke.   Do not drink alcohol or use drugs.   Only take medicine as told by your doctor.   Take prenatal vitamins as told. The vitamin should contain 1 milligram of folic acid.   Exercise.   Eat healthy foods. Eat regular, well-balanced meals.   You can have sex (intercourse) if there are no other problems with the pregnancy.   Do not use hot tubs, steam rooms, or saunas.   Wear a seat belt while driving.   Avoid raw meat, uncooked cheese, and litter boxes and soil used by cats.   Rest with your legs raised (elevated).   Make a list of emergency phone numbers. Keep this list with you.   Arrange for help when you come back home after delivering the baby.   Make a trial run to the hospital.   Take prenatal classes.   Prepare the baby's nursery.   Do not travel out of the city. If you absolutely have to, get permission from your doctor first.   Wear flat shoes. Do not wear high heels.  GET HELP RIGHT AWAY IF:    You have a temperature by mouth above 102 F (38.9 C), not controlled by medicine.   You have not felt the baby move for more than 1 hour. If you think the baby is not moving as much as normal, eat something with sugar in it or lie down on your left side for an hour. The baby should move at least 4 to 5 times per hour.   Fluid is coming from the vagina.   Blood is coming from the vagina. Light spotting is common, especially after sex (intercourse).   You have belly (abdominal) pain.   You have a bad smelling fluid (discharge) coming from the vagina. The fluid changes from clear to white.   You still feel sick to your stomach (nauseous).   You throw up (vomit) for more than 24 hours.   You have the chills.   You have shortness of breath.   You have a burning feeling when you  pee (urinate).   You lose or gain more than 2 pounds (0.9 kilograms) of weight over a week, or as told by your doctor.   Your face, hands, feet, or legs get puffy (swell).   You have a bad headache that will not go away.   You start to have problems seeing (blurry or double vision).   You fall, are in a car accident, or have any kind of trauma.   There is mental or physical violence at home.   You have any concerns or worries during your pregnancy.  MAKE SURE YOU:    Understand these instructions.   Will watch your condition.   Will get help right away if you are not doing well or get worse.  Document Released: 08/15/2009 Document Revised: 08/13/2011 Document Reviewed: 08/15/2009  [employer] Patient Information 2014 [employer].

## 2013-02-23 NOTE — Progress Notes (Signed)
Pulse- 91  Edema- hands/feet [employer] interpreter # [phone]

## 2013-03-02 ENCOUNTER — Ambulatory Visit (INDEPENDENT_AMBULATORY_CARE_PROVIDER_SITE_OTHER): Payer: Medicaid Other | Admitting: Advanced Practice Midwife

## 2013-03-02 ENCOUNTER — Ambulatory Visit: Payer: Medicaid Other | Admitting: *Deleted

## 2013-03-02 ENCOUNTER — Encounter: Payer: Medicaid Other | Admitting: *Deleted

## 2013-03-02 VITALS — BP 117/72 | Temp 97.6°F | Wt 186.6 lb

## 2013-03-02 DIAGNOSIS — O3663X0 Maternal care for excessive fetal growth, third trimester, not applicable or unspecified: Secondary | ICD-10-CM

## 2013-03-02 DIAGNOSIS — O3660X0 Maternal care for excessive fetal growth, unspecified trimester, not applicable or unspecified: Secondary | ICD-10-CM

## 2013-03-02 LAB — OB RESULTS CONSOLE GC/CHLAMYDIA: Gonorrhea: NEGATIVE

## 2013-03-02 LAB — OB RESULTS CONSOLE GBS: GBS: NEGATIVE

## 2013-03-02 LAB — POCT URINALYSIS DIP (DEVICE)
Bilirubin Urine: NEGATIVE
Hgb urine dipstick: NEGATIVE
Ketones, ur: NEGATIVE mg/dL
Leukocytes, UA: NEGATIVE
Specific Gravity, Urine: >=1.03 (ref 1.005–1.030)
pH: 6 (ref 5.0–8.0)

## 2013-03-02 MED ORDER — GLUCOSE BLOOD VI STRP: ORAL_STRIP | Status: DC

## 2013-03-02 NOTE — Progress Notes (Signed)
Doing well.  Good fetal movement, denies vaginal bleeding, LOF, regular contractions.  Had meter but cannot afford test strips ($90?) so has not been testing sugars.  Will see diabetic educator today, look at orders to assist pt to obtain materials. CBG today. GBS/GCC collected.

## 2013-03-02 NOTE — Progress Notes (Signed)
DIABETES Education Through the assistance of [employer]...ibuprofen confirmed that she does in fact have Medicaid. She does not know the pharmacy that she uses. We provided her with a hard copy RX for glucose testing strips. I noted on the RX that pt had been able to obtain glucometer and lancets, however she was quoted a co-pay of $90.00. If they had any questions to call the clinic. Directed to test 4X daily to include FBS, 2hpp breakfast, lunch and dinner.

## 2013-03-02 NOTE — Progress Notes (Signed)
P=97,   Used [employer]. Has not gotten meter and is not checking blood sugar, can't afford copay.

## 2013-03-03 LAB — GC/CHLAMYDIA PROBE AMP: GC Probe RNA: NEGATIVE

## 2013-03-05 LAB — CULTURE, BETA STREP (GROUP B ONLY)

## 2013-03-09 ENCOUNTER — Ambulatory Visit (INDEPENDENT_AMBULATORY_CARE_PROVIDER_SITE_OTHER): Payer: Medicaid Other | Admitting: Obstetrics and Gynecology

## 2013-03-09 ENCOUNTER — Encounter: Payer: Self-pay | Admitting: Obstetrics and Gynecology

## 2013-03-09 VITALS — BP 104/61 | Temp 97.0°F | Wt 189.1 lb

## 2013-03-09 DIAGNOSIS — O3660X0 Maternal care for excessive fetal growth, unspecified trimester, not applicable or unspecified: Secondary | ICD-10-CM

## 2013-03-09 DIAGNOSIS — O24419 Gestational diabetes mellitus in pregnancy, unspecified control: Secondary | ICD-10-CM

## 2013-03-09 DIAGNOSIS — O0930 Supervision of pregnancy with insufficient antenatal care, unspecified trimester: Secondary | ICD-10-CM

## 2013-03-09 DIAGNOSIS — Z609 Problem related to social environment, unspecified: Secondary | ICD-10-CM

## 2013-03-09 DIAGNOSIS — Z789 Other specified health status: Secondary | ICD-10-CM

## 2013-03-09 DIAGNOSIS — O99810 Abnormal glucose complicating pregnancy: Secondary | ICD-10-CM

## 2013-03-09 DIAGNOSIS — Z603 Acculturation difficulty: Secondary | ICD-10-CM

## 2013-03-09 DIAGNOSIS — O0933 Supervision of pregnancy with insufficient antenatal care, third trimester: Secondary | ICD-10-CM

## 2013-03-09 DIAGNOSIS — O3663X1 Maternal care for excessive fetal growth, third trimester, fetus 1: Secondary | ICD-10-CM

## 2013-03-09 LAB — POCT URINALYSIS DIP (DEVICE)
Bilirubin Urine: NEGATIVE
Glucose, UA: NEGATIVE mg/dL
Ketones, ur: NEGATIVE mg/dL
Leukocytes, UA: NEGATIVE
Protein, ur: NEGATIVE mg/dL

## 2013-03-09 NOTE — Progress Notes (Signed)
Patient doing well without complaints. CBG's majority within range a few postprandial in 130's. Patient admits to not following diet that day. F/u growth ultrasound ordered. FM/labor precautions reviewed

## 2013-03-09 NOTE — Progress Notes (Signed)
P= 96 [employer] interpreter. Pt. Reports not being able to sleep well and is requesting medication to help her sleep.

## 2013-03-11 ENCOUNTER — Ambulatory Visit (HOSPITAL_COMMUNITY): Admission: RE | Admit: 2013-03-11 | Payer: Medicaid Other | Source: Ambulatory Visit

## 2013-03-11 ENCOUNTER — Ambulatory Visit (HOSPITAL_COMMUNITY)
Admission: RE | Admit: 2013-03-11 | Discharge: 2013-03-11 | Disposition: A | Discharge status: Home / Self Care | Payer: Medicaid Other | Source: Ambulatory Visit | Attending: Obstetrics and Gynecology | Admitting: Obstetrics and Gynecology

## 2013-03-11 DIAGNOSIS — O24419 Gestational diabetes mellitus in pregnancy, unspecified control: Secondary | ICD-10-CM

## 2013-03-11 DIAGNOSIS — O0933 Supervision of pregnancy with insufficient antenatal care, third trimester: Secondary | ICD-10-CM

## 2013-03-11 DIAGNOSIS — O99810 Abnormal glucose complicating pregnancy: Secondary | ICD-10-CM | POA: Insufficient documentation

## 2013-03-11 DIAGNOSIS — O0930 Supervision of pregnancy with insufficient antenatal care, unspecified trimester: Secondary | ICD-10-CM | POA: Insufficient documentation

## 2013-03-16 ENCOUNTER — Other Ambulatory Visit: Payer: Self-pay | Admitting: *Deleted

## 2013-03-16 ENCOUNTER — Encounter: Payer: Medicaid Other | Admitting: Obstetrics and Gynecology

## 2013-03-16 ENCOUNTER — Ambulatory Visit (INDEPENDENT_AMBULATORY_CARE_PROVIDER_SITE_OTHER): Payer: Medicaid Other | Admitting: Obstetrics and Gynecology

## 2013-03-16 VITALS — BP 110/70 | Temp 97.7°F | Wt 188.4 lb

## 2013-03-16 DIAGNOSIS — O24419 Gestational diabetes mellitus in pregnancy, unspecified control: Secondary | ICD-10-CM

## 2013-03-16 DIAGNOSIS — O99810 Abnormal glucose complicating pregnancy: Secondary | ICD-10-CM

## 2013-03-16 DIAGNOSIS — O3660X0 Maternal care for excessive fetal growth, unspecified trimester, not applicable or unspecified: Secondary | ICD-10-CM | POA: Insufficient documentation

## 2013-03-16 DIAGNOSIS — O3660X1 Maternal care for excessive fetal growth, unspecified trimester, fetus 1: Secondary | ICD-10-CM

## 2013-03-16 LAB — POCT URINALYSIS DIP (DEVICE)
Glucose, UA: NEGATIVE mg/dL
Specific Gravity, Urine: >=1.03 (ref 1.005–1.030)
Urobilinogen, UA: 0.2 mg/dL (ref 0.0–1.0)

## 2013-03-16 MED ORDER — GLUCOSE BLOOD VI STRP: ORAL_STRIP | Status: DC

## 2013-03-16 NOTE — Patient Instructions (Signed)

## 2013-03-16 NOTE — Progress Notes (Signed)
Pulse- 81  Pressure- " pushing down" [employer] interpreter # [phone]

## 2013-03-16 NOTE — Telephone Encounter (Signed)
Patient and her support person came to window stating they need a refill for test strips sent to pharmacy- refill sent

## 2013-03-16 NOTE — Progress Notes (Signed)
No teststrips x 2 d. Rx given. Log book reviewed: F 83-118 (4/7 over 90) but states eating in the middle of the night when highs. , PPs within range. LGA on 10/8 with nl AFI.  NST today.

## 2013-03-17 ENCOUNTER — Telehealth: Payer: Self-pay | Admitting: *Deleted

## 2013-03-17 NOTE — Telephone Encounter (Signed)
Pt came to clinic earlier today and stated that the pharmacy would not fill her Rx for blood glucose test strips. I called and spoke with the pharmacist at [facility] [phone] and after discussion was able to get the problem resolved.  Pt may pisck up Rx later today.  I called pt with [employer] interpreter # [phone] and spoke w/pt's husband who was not at home at the time. He stated that they had tried 4 times to get the Rx but were unable. I assured him that he will be able to pick up today after 4:30 pm.  I also stated that Tonetta needs another appt @ clinic this week on Thursday (for NST). He agreed to the time of 10:30 am. He voiced understanding of all information and instructions.

## 2013-03-19 ENCOUNTER — Ambulatory Visit (INDEPENDENT_AMBULATORY_CARE_PROVIDER_SITE_OTHER): Payer: Medicaid Other | Admitting: *Deleted

## 2013-03-19 ENCOUNTER — Encounter (HOSPITAL_COMMUNITY): Payer: Self-pay

## 2013-03-19 ENCOUNTER — Inpatient Hospital Stay (HOSPITAL_COMMUNITY)
Admission: RE | Admit: 2013-03-19 | Discharge: 2013-03-24 | DRG: 766 | Disposition: A | Discharge status: Home / Self Care | Payer: Medicaid Other | Source: Ambulatory Visit | Attending: Obstetrics and Gynecology | Admitting: Obstetrics and Gynecology

## 2013-03-19 VITALS — BP 105/71 | HR 94 | Temp 98.5°F | Resp 17 | Ht 61.0 in | Wt 188.0 lb

## 2013-03-19 DIAGNOSIS — O339 Maternal care for disproportion, unspecified: Secondary | ICD-10-CM | POA: Diagnosis present

## 2013-03-19 DIAGNOSIS — O99814 Abnormal glucose complicating childbirth: Principal | ICD-10-CM | POA: Diagnosis present

## 2013-03-19 DIAGNOSIS — O1202 Gestational edema, second trimester: Secondary | ICD-10-CM

## 2013-03-19 DIAGNOSIS — O24419 Gestational diabetes mellitus in pregnancy, unspecified control: Secondary | ICD-10-CM

## 2013-03-19 DIAGNOSIS — O3660X0 Maternal care for excessive fetal growth, unspecified trimester, not applicable or unspecified: Secondary | ICD-10-CM

## 2013-03-19 DIAGNOSIS — O330 Maternal care for disproportion due to deformity of maternal pelvic bones: Secondary | ICD-10-CM | POA: Diagnosis present

## 2013-03-19 DIAGNOSIS — O99810 Abnormal glucose complicating pregnancy: Secondary | ICD-10-CM

## 2013-03-19 DIAGNOSIS — Z98891 History of uterine scar from previous surgery: Secondary | ICD-10-CM

## 2013-03-19 DIAGNOSIS — O3663X1 Maternal care for excessive fetal growth, third trimester, fetus 1: Secondary | ICD-10-CM

## 2013-03-19 HISTORY — DX: Gestational diabetes mellitus in pregnancy, unspecified control: O24.419

## 2013-03-19 LAB — CBC
HCT: 31.8 % — ABNORMAL LOW (ref 36.0–46.0)
Hemoglobin: 11.4 g/dL — ABNORMAL LOW (ref 12.0–15.0)
MCH: 26.3 pg (ref 26.0–34.0)
MCV: 73.4 fL — ABNORMAL LOW (ref 78.0–100.0)
Platelets: 191 10*3/uL (ref 150–400)
RBC: 4.33 MIL/uL (ref 3.87–5.11)
RDW: 14.5 % (ref 11.5–15.5)
WBC: 9.5 10*3/uL (ref 4.0–10.5)

## 2013-03-19 LAB — GLUCOSE, CAPILLARY: Glucose-Capillary: 88 mg/dL (ref 70–99)

## 2013-03-19 MED ORDER — LACTATED RINGERS IV SOLN
INTRAVENOUS | Status: DC
  Administered 2013-03-19 – 2013-03-20 (×3): via INTRAVENOUS

## 2013-03-19 MED ORDER — OXYTOCIN BOLUS FROM INFUSION: 500.0000 mL | INTRAVENOUS | Status: DC

## 2013-03-19 MED ORDER — ACETAMINOPHEN 325 MG PO TABS: 650.0000 mg | ORAL_TABLET | ORAL | Status: DC | PRN

## 2013-03-19 MED ORDER — CITRIC ACID-SODIUM CITRATE 334-500 MG/5ML PO SOLN
30.0000 mL | ORAL | Status: DC | PRN
  Filled 2013-03-19 (×2): qty 15

## 2013-03-19 MED ORDER — ONDANSETRON HCL 4 MG/2ML IJ SOLN: 4.0000 mg | Freq: Four times a day (QID) | INTRAMUSCULAR | Status: DC | PRN

## 2013-03-19 MED ORDER — BUTORPHANOL TARTRATE 1 MG/ML IJ SOLN
1.0000 mg | INTRAMUSCULAR | Status: DC | PRN
  Administered 2013-03-20 (×3): 1 mg via INTRAVENOUS
  Filled 2013-03-19 (×3): qty 1

## 2013-03-19 MED ORDER — IBUPROFEN 600 MG PO TABS: 600.0000 mg | ORAL_TABLET | Freq: Four times a day (QID) | ORAL | Status: DC | PRN

## 2013-03-19 MED ORDER — OXYCODONE-ACETAMINOPHEN 5-325 MG PO TABS: 1.0000 | ORAL_TABLET | ORAL | Status: DC | PRN

## 2013-03-19 MED ORDER — FLEET ENEMA 7-19 GM/118ML RE ENEM: 1.0000 | ENEMA | Freq: Every day | RECTAL | Status: DC | PRN

## 2013-03-19 MED ORDER — OXYTOCIN 40 UNITS IN LACTATED RINGERS INFUSION - SIMPLE MED
62.5000 mL/h | INTRAVENOUS | Status: DC
  Filled 2013-03-19: qty 1000

## 2013-03-19 MED ORDER — LACTATED RINGERS IV SOLN
500.0000 mL | INTRAVENOUS | Status: DC | PRN
  Administered 2013-03-19 – 2013-03-20 (×3): 500 mL via INTRAVENOUS

## 2013-03-19 MED ORDER — MISOPROSTOL 25 MCG QUARTER TABLET
25.0000 ug | ORAL_TABLET | ORAL | Status: DC | PRN
  Administered 2013-03-19 – 2013-03-20 (×3): 25 ug via VAGINAL
  Filled 2013-03-19 (×3): qty 0.25

## 2013-03-19 NOTE — Progress Notes (Signed)
Review of [place] from 10/8 indicates recommendation from Dr. Claudean Severance for delivery @ 39 wks due to poorly controlled gestational diabetes and macrosomia.  Telephone consult with Dr. Macon Large who agrees with delivery recommendation.  IOL scheduled this evening at 1930.  Plan of care discussed using [employer] interpreter # [phone] via telephone. Pt's husband was also conferenced in to the call. Pt was given complete information for reason for IOL. She and her husband voiced understanding and all questions were answered to their satisfaction. Total telephone call time = 30 min.

## 2013-03-19 NOTE — H&P (Signed)
Obstetric History and Physical  Donna Knapp is a 32 y.o. [id number] with IUP at [redacted]w[redacted]d presenting for IOL for poorly controlled A1 GDM. She was evaluated by MFM on 03/11/13; ultrasound showed EFW 4215 g (9 lb 5 oz)/>90%tile with AC >97%tile,  AFI 12.2 cm. The recommendation was for antenatal testing and delivery at 39 weeks. She has reactive NSTs, no other abnormal findings.  Patient is Amharic-speaking, Tax inspector interpreter used for this encounter.  Patient states she has been having rare contractions, no vaginal bleeding, intact membranes, with active fetal movement.    Prenatal Course Source of Care: [facility] with onset of care at 17 weeks Pregnancy complications or risks: Patient Active Problem List   Diagnosis Date Noted  . LGA (large for gestational age) fetus affecting mother, antepartum 03/16/2013  . Large for dates complicating pregnancy, antepartum 02/12/2013  . Language barrier, speaks Amharic ([place]) only 02/09/2013  . Gestational diabetes mellitus, antepartum 01/22/2013  . Gestational edema in second trimester 11/12/2012  . Prenatal care insufficient 10/15/2012   She plans to breastfeed. Desires circumcision for her female infant. She is undecided about postpartum contraception.   Prenatal labs and studies: ABO, Rh: A/POS/-- (05/14 1014) Antibody: NEG (05/14 1014) Rubella: 5.04 (05/14 1014) RPR: NON REAC (08/06 1014)  HBsAg: NEGATIVE (05/14 1014)  HIV: NON REACTIVE (08/06 1014)  NGE:XBMWUXLK (09/29 0000) 1 hr Glucola 136; 3 hr GTT 94,201,194,103 Genetic screening : Too late Anatomy US normal  Prenatal Transfer Tool  Maternal Diabetes: Yes:  Diabetes Type:  Diet controlled Genetic Screening: Declined Maternal Ultrasounds/Referrals: Abnormal:  Findings:   Other:Macrosomia Fetal Ultrasounds or other Referrals:  None Maternal Substance Abuse:  No Significant Maternal Medications:  None Significant Maternal Lab Results: Lab values include: Group B Strep negative  No  past medical history on file.  No past surgical history on file.  OB History  Gravida Para Term Preterm AB SAB TAB Ectopic Multiple Living  6 4 4  0 1 1 0 0 0 4    # Outcome Date GA Lbr Len/2nd Weight Sex Delivery Anes PTL Lv  6 CUR           5 TRM 02/01/07    F SVD     4 TRM 05/16/05    F SVD     3 TRM 01/21/01    M SVD     2 TRM 02/05/96    F SVD   Y  1 SAB               History   Social History  . Marital Status: Married    Spouse Name: N/A    Number of Children: N/A  . Years of Education: N/A   Social History Main Topics  . Smoking status: Never Smoker   . Smokeless tobacco: Never Used  . Alcohol Use: No  . Drug Use: No  . Sexual Activity: Yes   Other Topics Concern  . Not on file   Social History Narrative  . No narrative on file    No family history on file.  Prescriptions prior to admission  Medication Sig Dispense Refill  . ACCU-CHEK FASTCLIX LANCETS MISC 1 Device by Does not apply route 4 (four) times daily.  102 each  2  . Blood Glucose Monitoring Suppl (ACCU-CHEK NANO SMARTVIEW) W/DEVICE KIT 1 Device by Does not apply route as directed. Check fasting, and two hours after breakfast, lunch and dinner  1 kit  0  . glucose blood (ACCU-CHEK SMARTVIEW) test strip  Use as instructed to check blood sugars  100 each  12  . Prenatal Vit-Fe Fumarate-FA (PRENATAL VITAMINS PLUS) 27-1 MG TABS Take 1 tablet by mouth daily.  30 tablet  2    No Known Allergies  Review of Systems: Negative except for what is mentioned in HPI.  Physical Exam: BP 120/71  Pulse 106  Temp(Src) 98.4 F (36.9 C) (Oral)  Resp 20  Ht 5\' 1"  (1.549 m)  Wt 188 lb (85.276 kg)  BMI 35.54 kg/m2  LMP 06/18/2012 GENERAL: Well-developed, well-nourished female in no acute distress.  LUNGS: Clear to auscultation bilaterally.  HEART: Regular rate and rhythm. ABDOMEN: Soft, nontender, nondistended, gravid. EXTREMITIES: Nontender, no edema, 2+ distal pulses. Cervical Exam:  0/thick/high/vertex FHT:  Baseline rate 140 bpm   Variability moderate  Accelerations present   Decelerations none Contractions: Every 5-9  mins   Pertinent Labs/Studies:   No results found for this or any previous visit (from the past 24 hour(s)).  Assessment : Donna Knapp is a 32 y.o. [id number] at [redacted]w[redacted]d being admitted for induction of labor for A1GDM, with concern about macrosomia.  Plan: Labor: Expectant management. Induction of labor will start with cytotec, proceed to foley bulb, pitocin, AROM as indicated. GDM: CBGs every 4 hours, diabetic diet FWB: Reassuring fetal heart tracing.  GBS negative Delivery plan: Hopeful for vaginal delivery but MFM recommends low threshold for cesarean delivery for failure to dilate or failure to descend, and avoidance of operative delivery given concern about macrosomia, increased AC in the 97%tile which could lead to should dystocia.  Jaynie Collins, MD, FACOG Attending Obstetrician & Gynecologist Faculty Practice, [facility] [place]

## 2013-03-19 NOTE — Progress Notes (Signed)
NST performed today was reviewed and was found to be reactive.  Will proceed with IOL today as scheduled for poorly controlled GDM as recommended by MFM.

## 2013-03-20 ENCOUNTER — Encounter (HOSPITAL_COMMUNITY): Payer: Medicaid Other | Admitting: Anesthesiology

## 2013-03-20 ENCOUNTER — Inpatient Hospital Stay (HOSPITAL_COMMUNITY): Payer: Medicaid Other | Admitting: Anesthesiology

## 2013-03-20 ENCOUNTER — Encounter (HOSPITAL_COMMUNITY): Payer: Self-pay

## 2013-03-20 LAB — GLUCOSE, CAPILLARY
Glucose-Capillary: 95 mg/dL (ref 70–99)
Glucose-Capillary: 75 mg/dL (ref 70–99)
Glucose-Capillary: 72 mg/dL (ref 70–99)
Glucose-Capillary: 76 mg/dL (ref 70–99)
Glucose-Capillary: 79 mg/dL (ref 70–99)

## 2013-03-20 LAB — HEMOGLOBIN A1C
Hgb A1c MFr Bld: 5 %
Mean Plasma Glucose: 97 mg/dL

## 2013-03-20 LAB — ABO/RH: ABO/RH(D): A POS

## 2013-03-20 MED ORDER — LIDOCAINE HCL (PF) 1 % IJ SOLN
INTRAMUSCULAR | Status: AC
  Filled 2013-03-20: qty 30

## 2013-03-20 MED ORDER — OXYTOCIN 40 UNITS IN LACTATED RINGERS INFUSION - SIMPLE MED
1.0000 m[IU]/min | INTRAVENOUS | Status: DC
  Administered 2013-03-20: 2 m[IU]/min via INTRAVENOUS
  Filled 2013-03-20: qty 1000

## 2013-03-20 MED ORDER — FENTANYL 2.5 MCG/ML BUPIVACAINE 1/10 % EPIDURAL INFUSION (WH - ANES)
14.0000 mL/h | INTRAMUSCULAR | Status: DC | PRN
  Filled 2013-03-20: qty 125

## 2013-03-20 MED ORDER — LIDOCAINE HCL (PF) 1 % IJ SOLN
INTRAMUSCULAR | Status: DC | PRN
  Administered 2013-03-20 (×2): 4 mL

## 2013-03-20 MED ORDER — PHENYLEPHRINE 40 MCG/ML (10ML) SYRINGE FOR IV PUSH (FOR BLOOD PRESSURE SUPPORT)
80.0000 ug | PREFILLED_SYRINGE | INTRAVENOUS | Status: DC | PRN
  Filled 2013-03-20: qty 5

## 2013-03-20 MED ORDER — FENTANYL 2.5 MCG/ML BUPIVACAINE 1/10 % EPIDURAL INFUSION (WH - ANES)
INTRAMUSCULAR | Status: DC | PRN
  Administered 2013-03-20: 12 mL/h via EPIDURAL

## 2013-03-20 MED ORDER — TERBUTALINE SULFATE 1 MG/ML IJ SOLN: 0.2500 mg | Freq: Once | INTRAMUSCULAR | Status: AC | PRN

## 2013-03-20 MED ORDER — LACTATED RINGERS IV SOLN
500.0000 mL | Freq: Once | INTRAVENOUS | Status: AC
  Administered 2013-03-20: 500 mL via INTRAVENOUS

## 2013-03-20 MED ORDER — EPHEDRINE 5 MG/ML INJ: 10.0000 mg | INTRAVENOUS | Status: DC | PRN

## 2013-03-20 MED ORDER — DIPHENHYDRAMINE HCL 50 MG/ML IJ SOLN: 12.5000 mg | INTRAMUSCULAR | Status: DC | PRN

## 2013-03-20 MED ORDER — PHENYLEPHRINE 40 MCG/ML (10ML) SYRINGE FOR IV PUSH (FOR BLOOD PRESSURE SUPPORT): 80.0000 ug | PREFILLED_SYRINGE | INTRAVENOUS | Status: DC | PRN

## 2013-03-20 MED ORDER — EPHEDRINE 5 MG/ML INJ
10.0000 mg | INTRAVENOUS | Status: DC | PRN
  Filled 2013-03-20: qty 4

## 2013-03-20 NOTE — Progress Notes (Signed)
Donna Knapp is a 32 y.o. [id number] at [redacted]w[redacted]d admitted for IOL gDM  Subjective:  Pt uncomfortable and pushing with contractions. +FM.   Objective: BP 132/88  Pulse 73  Temp(Src) 98.1 F (36.7 C) (Oral)  Resp 20  Ht 5\' 1"  (1.549 m)  Wt 85.276 kg (188 lb)  BMI 35.54 kg/m2  SpO2 100%  LMP 06/18/2012      FHT:  FHR: 140 bpm, variability: moderate,  accelerations:  Present,  decelerations:  Absent UC:   irregular, spaced out every 2-7 minutes  SVE:   Dilation: 6 Effacement (%): 80 Station: -1 Exam by:: GPayne, RN  Labs: Lab Results  Component Value Date   WBC 9.5 03/19/2013   HGB 11.4* 03/19/2013   HCT 31.8* 03/19/2013   MCV 73.4* 03/19/2013   PLT 191 03/19/2013    Assessment / Plan: IOL for gDM and progressing  Labor: slowed down some contractions and moving slower. will start pitocin Fetal Wellbeing:  Category I Pain Control:  discussed with pt risks to pushing prior to cervix being dilated and pt now contemplating epidural I/D:  n/a Anticipated MOD:  NSVD  Javonnie Illescas L 03/20/2013, 8:34 PM

## 2013-03-20 NOTE — Anesthesia Preprocedure Evaluation (Addendum)
Anesthesia Evaluation  Patient identified by MRN, date of birth, ID band Patient awake    Reviewed: Allergy & Precautions, H&P , Patient's Chart, lab work & pertinent test results  Airway Mallampati: III TM Distance: >3 FB Neck ROM: Full    Dental no notable dental hx. (+) Teeth Intact   Pulmonary neg pulmonary ROS,  breath sounds clear to auscultation  Pulmonary exam normal       Cardiovascular negative cardio ROS  Rhythm:Regular Rate:Normal     Neuro/Psych negative neurological ROS  negative psych ROS   GI/Hepatic negative GI ROS, Neg liver ROS,   Endo/Other  diabetes, Well Controlled, Gestational  Renal/GU negative Renal ROS  negative genitourinary   Musculoskeletal negative musculoskeletal ROS (+)   Abdominal (+) + obese,   Peds  Hematology negative hematology ROS (+)   Anesthesia Other Findings   Reproductive/Obstetrics (+) Pregnancy                           Anesthesia Physical Anesthesia Plan  ASA: II and emergent  Anesthesia Plan: Epidural   Post-op Pain Management:    Induction:   Airway Management Planned: Natural Airway  Additional Equipment:   Intra-op Plan:   Post-operative Plan:   Informed Consent: I have reviewed the patients History and Physical, chart, labs and discussed the procedure including the risks, benefits and alternatives for the proposed anesthesia with the patient or authorized representative who has indicated his/her understanding and acceptance.     Plan Discussed with: Anesthesiologist, CRNA and Surgeon  Anesthesia Plan Comments: (Patient for C/Section for fetal intolerance to labor. Will use epidural for C/Section. Pt. Speaks Amharic, sister speaks some [place].)       Anesthesia Quick Evaluation

## 2013-03-20 NOTE — Anesthesia Procedure Notes (Signed)
Epidural Patient location during procedure: OB Start time: 03/20/2013 8:43 PM  Staffing Anesthesiologist: Uel Davidow A. Performed by: anesthesiologist   Preanesthetic Checklist Completed: patient identified, site marked, surgical consent, pre-op evaluation, timeout performed, IV checked, risks and benefits discussed and monitors and equipment checked  Epidural Patient position: sitting Prep: site prepped and draped and DuraPrep Patient monitoring: continuous pulse ox and blood pressure Approach: midline Injection technique: LOR air  Needle:  Needle type: Tuohy  Needle gauge: 17 G Needle length: 9 cm and 9 Needle insertion depth: 6 cm Catheter type: closed end flexible Catheter size: 19 Gauge Catheter at skin depth: 11 cm Test dose: negative and Other  Assessment Events: blood not aspirated, injection not painful, no injection resistance, negative IV test and no paresthesia  Additional Notes Patient identified. Risks and benefits discussed including failed block, incomplete  Pain control, post dural puncture headache, nerve damage, paralysis, blood pressure Changes, nausea, vomiting, reactions to medications-both toxic and allergic and post Partum back pain. All questions were answered. Patient expressed understanding and wished to proceed. Sterile technique was used throughout procedure. Epidural site was Dressed with sterile barrier dressing. No paresthesias, signs of intravascular injection Or signs of intrathecal spread were encountered.  Patient was more comfortable after the epidural was dosed. Please see RN's note for documentation of vital signs and FHR which are stable. Reason for block:procedure for pain

## 2013-03-20 NOTE — Progress Notes (Signed)
Donna Knapp is a 32 y.o. [id number] at [redacted]w[redacted]d admitted for induction of labor due to Gestational diabetes.  Subjective: Pt sleeping comfortably. No acute issues. Cytotec redosed now at 2  Objective: BP 118/56  Pulse 85  Temp(Src) 98.8 F (37.1 C) (Oral)  Resp 18  Ht 5\' 1"  (1.549 m)  Wt 85.276 kg (188 lb)  BMI 35.54 kg/m2  LMP 06/18/2012      FHT:  FHR: 150s bpm, variability: moderate,  accelerations:  Present,  decelerations:  Present earlies UC:   Irregular, q2-4 with triplets SVE:   Dilation: 1 Effacement (%): 30 Station: -3 Exam by:: V.Mensah,RN  Labs: Lab Results  Component Value Date   WBC 9.5 03/19/2013   HGB 11.4* 03/19/2013   HCT 31.8* 03/19/2013   MCV 73.4* 03/19/2013   PLT 191 03/19/2013    Assessment / Plan: Induction of labor due to gestational diabetes,  progressing well on pitocin  Labor: IOL will continue cytotec PRN for tx, will recheck in AM and consider foley bulb vs pitocin vs additional dose Preeclampsia:  no signs or symptoms of toxicity Fetal Wellbeing:  Category II Pain Control:  comfortable without medications at this tim I/D:  GBS Neg Anticipated MOD:  NSVD  Kellie Murrill RYAN 03/20/2013, 4:31 AM

## 2013-03-20 NOTE — Progress Notes (Signed)
Donna Knapp is a 32 y.o. [id number] at [redacted]w[redacted]d admitted for IOL for gDM  Subjective:  Pt still uncomfortable with contractions. +FM. Just had a large gush of fluid-- clear.   Objective: BP 128/58  Pulse 91  Temp(Src) 98.9 F (37.2 C) (Oral)  Resp 20  Ht 5\' 1"  (1.549 m)  Wt 85.276 kg (188 lb)  BMI 35.54 kg/m2  SpO2 100%  LMP 06/18/2012      FHT:  FHR: 140 bpm, variability: moderate,  accelerations:  Present,  decelerations:  Absent UC:   regular, every 2-3 minutes SVE:   Dilation: 1 Effacement (%): 50 Station: Ballotable Exam by:: dr. Reola Calkins  Labs: Lab Results  Component Value Date   WBC 9.5 03/19/2013   HGB 11.4* 03/19/2013   HCT 31.8* 03/19/2013   MCV 73.4* 03/19/2013   PLT 191 03/19/2013    Assessment / Plan: IOL for A1DM. now s/p SROM  Labor: now s/p SROM with clear fluid and in a better contraction pattern Fetal Wellbeing:  Category I Pain Control:  stadol I/D:  n/a Anticipated MOD:  NSVD  Yuan Gann L 03/20/2013, 2:04 PM

## 2013-03-20 NOTE — Progress Notes (Signed)
Donna Knapp is a 32 y.o. [id number] at [redacted]w[redacted]d admitted for induction of labor due to Gestational diabetes.  Subjective: Pt sleeping comfortably. No acute issues. Cytotec x1  Objective: BP 112/60  Pulse 90  Temp(Src) 98.4 F (36.9 C) (Oral)  Resp 18  Ht 5\' 1"  (1.549 m)  Wt 85.276 kg (188 lb)  BMI 35.54 kg/m2  LMP 06/18/2012      FHT:  FHR: 150s bpm, variability: moderate,  accelerations:  Present,  decelerations:  Absent occassional 160s UC:   Irregular SVE:   Dilation: Closed Exam by:: V.Mensah,RN  Labs: Lab Results  Component Value Date   WBC 9.5 03/19/2013   HGB 11.4* 03/19/2013   HCT 31.8* 03/19/2013   MCV 73.4* 03/19/2013   PLT 191 03/19/2013    Assessment / Plan: Induction of labor due to gestational diabetes,  progressing well on pitocin  Labor: IOL will continue cytotec PRN for tx Preeclampsia:  no signs or symptoms of toxicity Fetal Wellbeing:  Category II and will give bolus and reeval Pain Control:  comfortable without medications at this tim I/D:  GBS Neg Anticipated MOD:  NSVD  Donna Knapp RYAN 03/20/2013, 12:12 AM

## 2013-03-20 NOTE — Progress Notes (Signed)
Donna Knapp is a 32 y.o. [id number] at [redacted]w[redacted]d  admitted for induction of labor due to poorly controlled A1DM.  Subjective:  Pt uncomfortable with contractions. Contractions stacking on top of each other some. +FM.   Objective: BP 128/58  Pulse 91  Temp(Src) 98.9 F (37.2 C) (Oral)  Resp 20  Ht 5\' 1"  (1.549 m)  Wt 85.276 kg (188 lb)  BMI 35.54 kg/m2  SpO2 100%  LMP 06/18/2012      FHT:  FHR: 150 bpm, variability: mod with periods of min,  accelerations:  Present,  decelerations:  Present occasional variables UC:   irregular, every 1-6 minutes SVE:   Dilation: Closed Effacement (%): Thick Station: -3 Exam by:: dr. Reola Calkins  Labs: Lab Results  Component Value Date   WBC 9.5 03/19/2013   HGB 11.4* 03/19/2013   HCT 31.8* 03/19/2013   MCV 73.4* 03/19/2013   PLT 191 03/19/2013    Assessment / Plan: IOL for A1DM. EFW >97%ile. currently in cervical ripening stage  Labor: cervix closed. attempted FB but unable to pass through the internal os. will monitor and if ctx space out some, start pitocin.  Fetal Wellbeing:  Category I Pain Control:  stadol I/D:  n/a Anticipated MOD:  NSVD  Donna Knapp 03/20/2013, 11:54 AM

## 2013-03-21 ENCOUNTER — Encounter (HOSPITAL_COMMUNITY): Payer: Self-pay

## 2013-03-21 ENCOUNTER — Encounter (HOSPITAL_COMMUNITY)
Admission: RE | Disposition: A | Discharge status: Home / Self Care | Payer: Self-pay | Source: Ambulatory Visit | Attending: Obstetrics and Gynecology

## 2013-03-21 DIAGNOSIS — O3660X0 Maternal care for excessive fetal growth, unspecified trimester, not applicable or unspecified: Secondary | ICD-10-CM

## 2013-03-21 DIAGNOSIS — O99814 Abnormal glucose complicating childbirth: Secondary | ICD-10-CM

## 2013-03-21 DIAGNOSIS — O330 Maternal care for disproportion due to deformity of maternal pelvic bones: Secondary | ICD-10-CM

## 2013-03-21 DIAGNOSIS — O339 Maternal care for disproportion, unspecified: Secondary | ICD-10-CM

## 2013-03-21 LAB — TYPE AND SCREEN: Unit division: 0

## 2013-03-21 LAB — GLUCOSE, CAPILLARY: Glucose-Capillary: 125 mg/dL — ABNORMAL HIGH (ref 70–99)

## 2013-03-21 LAB — PREPARE RBC (CROSSMATCH)

## 2013-03-21 SURGERY — Surgical Case
Anesthesia: Epidural | Site: Abdomen | Wound class: Clean Contaminated

## 2013-03-21 MED ORDER — SIMETHICONE 80 MG PO CHEW
80.0000 mg | CHEWABLE_TABLET | Freq: Three times a day (TID) | ORAL | Status: DC
  Administered 2013-03-21 – 2013-03-24 (×11): 80 mg via ORAL
  Filled 2013-03-21 (×11): qty 1

## 2013-03-21 MED ORDER — OXYTOCIN 40 UNITS IN LACTATED RINGERS INFUSION - SIMPLE MED
62.5000 mL/h | INTRAVENOUS | Status: AC
  Administered 2013-03-21: 62.5 mL/h via INTRAVENOUS
  Filled 2013-03-21: qty 1000

## 2013-03-21 MED ORDER — NALOXONE HCL 1 MG/ML IJ SOLN
1.0000 ug/kg/h | INTRAMUSCULAR | Status: DC | PRN
  Filled 2013-03-21: qty 2

## 2013-03-21 MED ORDER — SENNOSIDES-DOCUSATE SODIUM 8.6-50 MG PO TABS
2.0000 | ORAL_TABLET | ORAL | Status: DC
  Administered 2013-03-21 – 2013-03-23 (×3): 2 via ORAL
  Filled 2013-03-21 (×3): qty 2

## 2013-03-21 MED ORDER — DIPHENHYDRAMINE HCL 25 MG PO CAPS: 25.0000 mg | ORAL_CAPSULE | Freq: Four times a day (QID) | ORAL | Status: DC | PRN

## 2013-03-21 MED ORDER — INFLUENZA VAC SPLIT QUAD 0.5 ML IM SUSP: 0.5000 mL | INTRAMUSCULAR | Status: AC

## 2013-03-21 MED ORDER — METOCLOPRAMIDE HCL 5 MG/ML IJ SOLN: 10.0000 mg | Freq: Three times a day (TID) | INTRAMUSCULAR | Status: DC | PRN

## 2013-03-21 MED ORDER — LACTATED RINGERS IV BOLUS (SEPSIS)
500.0000 mL | Freq: Once | INTRAVENOUS | Status: AC
  Administered 2013-03-21: 500 mL via INTRAVENOUS

## 2013-03-21 MED ORDER — DIBUCAINE 1 % RE OINT: 1.0000 "application " | TOPICAL_OINTMENT | RECTAL | Status: DC | PRN

## 2013-03-21 MED ORDER — MEPERIDINE HCL 25 MG/ML IJ SOLN: 6.2500 mg | INTRAMUSCULAR | Status: DC | PRN

## 2013-03-21 MED ORDER — SODIUM CHLORIDE 0.9 % IJ SOLN: 3.0000 mL | INTRAMUSCULAR | Status: DC | PRN

## 2013-03-21 MED ORDER — DIPHENHYDRAMINE HCL 50 MG/ML IJ SOLN: 12.5000 mg | INTRAMUSCULAR | Status: DC | PRN

## 2013-03-21 MED ORDER — ONDANSETRON HCL 4 MG/2ML IJ SOLN: 4.0000 mg | Freq: Three times a day (TID) | INTRAMUSCULAR | Status: DC | PRN

## 2013-03-21 MED ORDER — CEFAZOLIN SODIUM-DEXTROSE 2-3 GM-% IV SOLR
INTRAVENOUS | Status: DC | PRN
  Administered 2013-03-21: 2 g via INTRAVENOUS

## 2013-03-21 MED ORDER — LACTATED RINGERS IV SOLN
INTRAVENOUS | Status: DC | PRN
  Administered 2013-03-21 (×3): via INTRAVENOUS

## 2013-03-21 MED ORDER — OXYTOCIN 10 UNIT/ML IJ SOLN
INTRAMUSCULAR | Status: AC
  Filled 2013-03-21: qty 4

## 2013-03-21 MED ORDER — DIPHENHYDRAMINE HCL 50 MG/ML IJ SOLN: 25.0000 mg | INTRAMUSCULAR | Status: DC | PRN

## 2013-03-21 MED ORDER — OXYTOCIN 10 UNIT/ML IJ SOLN
40.0000 [IU] | INTRAVENOUS | Status: DC | PRN
  Administered 2013-03-21: 40 [IU] via INTRAVENOUS

## 2013-03-21 MED ORDER — OXYCODONE-ACETAMINOPHEN 5-325 MG PO TABS
1.0000 | ORAL_TABLET | ORAL | Status: DC | PRN
  Administered 2013-03-22 – 2013-03-24 (×7): 1 via ORAL
  Filled 2013-03-21 (×7): qty 1

## 2013-03-21 MED ORDER — ZOLPIDEM TARTRATE 5 MG PO TABS: 5.0000 mg | ORAL_TABLET | Freq: Every evening | ORAL | Status: DC | PRN

## 2013-03-21 MED ORDER — MORPHINE SULFATE (PF) 0.5 MG/ML IJ SOLN
INTRAMUSCULAR | Status: DC | PRN
  Administered 2013-03-21: 3 mg via EPIDURAL

## 2013-03-21 MED ORDER — KETOROLAC TROMETHAMINE 30 MG/ML IJ SOLN: 30.0000 mg | Freq: Four times a day (QID) | INTRAMUSCULAR | Status: DC | PRN

## 2013-03-21 MED ORDER — KETOROLAC TROMETHAMINE 30 MG/ML IJ SOLN
30.0000 mg | Freq: Four times a day (QID) | INTRAMUSCULAR | Status: DC | PRN
  Administered 2013-03-21: 30 mg via INTRAVENOUS
  Filled 2013-03-21: qty 1

## 2013-03-21 MED ORDER — KETOROLAC TROMETHAMINE 30 MG/ML IJ SOLN
INTRAMUSCULAR | Status: AC
  Administered 2013-03-21: 30 mg via INTRAVENOUS
  Filled 2013-03-21: qty 1

## 2013-03-21 MED ORDER — 0.9 % SODIUM CHLORIDE (POUR BTL) OPTIME
TOPICAL | Status: DC | PRN
  Administered 2013-03-21: 1000 mL

## 2013-03-21 MED ORDER — SODIUM BICARBONATE 8.4 % IV SOLN
INTRAVENOUS | Status: AC
  Filled 2013-03-21: qty 50

## 2013-03-21 MED ORDER — SCOPOLAMINE 1 MG/3DAYS TD PT72
MEDICATED_PATCH | TRANSDERMAL | Status: AC
  Administered 2013-03-21: 1.5 mg via TRANSDERMAL
  Filled 2013-03-21: qty 1

## 2013-03-21 MED ORDER — LIDOCAINE-EPINEPHRINE (PF) 2 %-1:200000 IJ SOLN
INTRAMUSCULAR | Status: AC
  Filled 2013-03-21: qty 20

## 2013-03-21 MED ORDER — MENTHOL 3 MG MT LOZG: 1.0000 | LOZENGE | OROMUCOSAL | Status: DC | PRN

## 2013-03-21 MED ORDER — KETOROLAC TROMETHAMINE 30 MG/ML IJ SOLN
30.0000 mg | Freq: Four times a day (QID) | INTRAMUSCULAR | Status: DC | PRN
  Administered 2013-03-21: 30 mg via INTRAVENOUS

## 2013-03-21 MED ORDER — SODIUM BICARBONATE 8.4 % IV SOLN
INTRAVENOUS | Status: DC | PRN
  Administered 2013-03-21 (×3): 5 mL via EPIDURAL
  Administered 2013-03-21: 2 mL via EPIDURAL

## 2013-03-21 MED ORDER — FENTANYL CITRATE 0.05 MG/ML IJ SOLN: 25.0000 ug | INTRAMUSCULAR | Status: DC | PRN

## 2013-03-21 MED ORDER — ONDANSETRON HCL 4 MG PO TABS: 4.0000 mg | ORAL_TABLET | ORAL | Status: DC | PRN

## 2013-03-21 MED ORDER — WITCH HAZEL-GLYCERIN EX PADS: 1.0000 "application " | MEDICATED_PAD | CUTANEOUS | Status: DC | PRN

## 2013-03-21 MED ORDER — LACTATED RINGERS IV SOLN
INTRAVENOUS | Status: DC
  Administered 2013-03-21 – 2013-03-22 (×5): via INTRAVENOUS

## 2013-03-21 MED ORDER — ONDANSETRON HCL 4 MG/2ML IJ SOLN
INTRAMUSCULAR | Status: DC | PRN
  Administered 2013-03-21: 4 mg via INTRAMUSCULAR

## 2013-03-21 MED ORDER — DIPHENHYDRAMINE HCL 25 MG PO CAPS: 25.0000 mg | ORAL_CAPSULE | ORAL | Status: DC | PRN

## 2013-03-21 MED ORDER — MORPHINE SULFATE 0.5 MG/ML IJ SOLN
INTRAMUSCULAR | Status: AC
  Filled 2013-03-21: qty 10

## 2013-03-21 MED ORDER — NALBUPHINE SYRINGE 5 MG/0.5 ML
5.0000 mg | INJECTION | INTRAMUSCULAR | Status: DC | PRN
  Filled 2013-03-21: qty 1

## 2013-03-21 MED ORDER — LACTATED RINGERS IV SOLN
INTRAVENOUS | Status: DC | PRN
  Administered 2013-03-21 (×2): via INTRAVENOUS

## 2013-03-21 MED ORDER — SCOPOLAMINE 1 MG/3DAYS TD PT72
1.0000 | MEDICATED_PATCH | Freq: Once | TRANSDERMAL | Status: DC
  Administered 2013-03-21: 1.5 mg via TRANSDERMAL

## 2013-03-21 MED ORDER — IBUPROFEN 600 MG PO TABS
600.0000 mg | ORAL_TABLET | Freq: Four times a day (QID) | ORAL | Status: DC
  Administered 2013-03-21 – 2013-03-24 (×13): 600 mg via ORAL
  Filled 2013-03-21 (×13): qty 1

## 2013-03-21 MED ORDER — PRENATAL MULTIVITAMIN CH
1.0000 | ORAL_TABLET | Freq: Every day | ORAL | Status: DC
  Administered 2013-03-21 – 2013-03-24 (×4): 1 via ORAL
  Filled 2013-03-21 (×4): qty 1

## 2013-03-21 MED ORDER — TETANUS-DIPHTH-ACELL PERTUSSIS 5-2.5-18.5 LF-MCG/0.5 IM SUSP: 0.5000 mL | Freq: Once | INTRAMUSCULAR | Status: DC

## 2013-03-21 MED ORDER — SIMETHICONE 80 MG PO CHEW
80.0000 mg | CHEWABLE_TABLET | ORAL | Status: DC
  Administered 2013-03-21 – 2013-03-23 (×3): 80 mg via ORAL
  Filled 2013-03-21 (×3): qty 1

## 2013-03-21 MED ORDER — ONDANSETRON HCL 4 MG/2ML IJ SOLN: 4.0000 mg | INTRAMUSCULAR | Status: DC | PRN

## 2013-03-21 MED ORDER — LANOLIN HYDROUS EX OINT: 1.0000 "application " | TOPICAL_OINTMENT | CUTANEOUS | Status: DC | PRN

## 2013-03-21 MED ORDER — SIMETHICONE 80 MG PO CHEW: 80.0000 mg | CHEWABLE_TABLET | ORAL | Status: DC | PRN

## 2013-03-21 MED ORDER — METOCLOPRAMIDE HCL 5 MG/ML IJ SOLN: 10.0000 mg | Freq: Once | INTRAMUSCULAR | Status: DC | PRN

## 2013-03-21 MED ORDER — ONDANSETRON HCL 4 MG/2ML IJ SOLN
INTRAMUSCULAR | Status: AC
  Filled 2013-03-21: qty 2

## 2013-03-21 MED ORDER — NALOXONE HCL 0.4 MG/ML IJ SOLN: 0.4000 mg | INTRAMUSCULAR | Status: DC | PRN

## 2013-03-21 SURGICAL SUPPLY — 33 items
BENZOIN TINCTURE PRP APPL 2/3 (GAUZE/BANDAGES/DRESSINGS) ×2 IMPLANT
CLAMP CORD UMBIL (MISCELLANEOUS) ×2 IMPLANT
CLOTH BEACON ORANGE TIMEOUT ST (SAFETY) ×2 IMPLANT
DRAPE LG THREE QUARTER DISP (DRAPES) ×4 IMPLANT
DRSG OPSITE POSTOP 4X10 (GAUZE/BANDAGES/DRESSINGS) ×2 IMPLANT
DURAPREP 26ML APPLICATOR (WOUND CARE) ×2 IMPLANT
ELECT REM PT RETURN 9FT ADLT (ELECTROSURGICAL) ×2
ELECTRODE REM PT RTRN 9FT ADLT (ELECTROSURGICAL) ×1 IMPLANT
EXTRACTOR VACUUM KIWI (MISCELLANEOUS) IMPLANT
GLOVE BIO SURGEON ST LM GN SZ9 (GLOVE) ×2 IMPLANT
GLOVE BIOGEL PI IND STRL 9 (GLOVE) ×1 IMPLANT
GLOVE BIOGEL PI INDICATOR 9 (GLOVE) ×1
GOWN PREVENTION PLUS XLARGE (GOWN DISPOSABLE) ×2 IMPLANT
GOWN STRL REIN 3XL LVL4 (GOWN DISPOSABLE) ×2 IMPLANT
GOWN STRL REIN XL XLG (GOWN DISPOSABLE) IMPLANT
NEEDLE HYPO 25X5/8 SAFETYGLIDE (NEEDLE) ×2 IMPLANT
NS IRRIG 1000ML POUR BTL (IV SOLUTION) ×2 IMPLANT
PACK C SECTION WH (CUSTOM PROCEDURE TRAY) ×2 IMPLANT
PAD OB MATERNITY 4.3X12.25 (PERSONAL CARE ITEMS) ×2 IMPLANT
RETRACTOR WND ALEXIS 25 LRG (MISCELLANEOUS) IMPLANT
RTRCTR C-SECT PINK 25CM LRG (MISCELLANEOUS) ×2 IMPLANT
RTRCTR WOUND ALEXIS 25CM LRG (MISCELLANEOUS)
STRIP CLOSURE SKIN 1/2X4 (GAUZE/BANDAGES/DRESSINGS) ×2 IMPLANT
SUT CHROMIC 0 CTX 36 (SUTURE) ×4 IMPLANT
SUT VIC AB 0 CT1 27 (SUTURE) ×1
SUT VIC AB 0 CT1 27XBRD ANBCTR (SUTURE) ×1 IMPLANT
SUT VIC AB 2-0 CT1 27 (SUTURE) ×2
SUT VIC AB 2-0 CT1 TAPERPNT 27 (SUTURE) ×2 IMPLANT
SUT VIC AB 4-0 KS 27 (SUTURE) ×2 IMPLANT
SYR BULB IRRIGATION 50ML (SYRINGE) IMPLANT
TOWEL OR 17X24 6PK STRL BLUE (TOWEL DISPOSABLE) ×2 IMPLANT
TRAY FOLEY CATH 14FR (SET/KITS/TRAYS/PACK) IMPLANT
WATER STERILE IRR 1000ML POUR (IV SOLUTION) IMPLANT

## 2013-03-21 NOTE — Progress Notes (Signed)
Zamiyah Lynasia Meloche is a 32 y.o. [id number] at [redacted]w[redacted]d   Subjective: Comfortable with epidural; called to see pt for FHR variables; foley catheter being inserted upon my arrival to the room  Objective: BP 113/70  Pulse 106  Temp(Src) 98.8 F (37.1 C) (Oral)  Resp 18  Ht 5\' 1"  (1.549 m)  Wt 85.276 kg (188 lb)  BMI 35.54 kg/m2  SpO2 100%  LMP 06/18/2012      FHT:  FHR: 140 bpm, variability: moderate,  accelerations:  Present,  decelerations:  Absent- variables to 70's mostly early-onset with ctx UC:   regular, every 2-3 minutes with Pitocin @ 54mu/min SVE:   Dilation: 9 Effacement (%): 90 Station: -1;0 Exam by:: GPayne, RN- attempted to insert IUPC without success due to advanced dilation/station  Labs: Lab Results  Component Value Date   WBC 9.5 03/19/2013   HGB 11.4* 03/19/2013   HCT 31.8* 03/19/2013   MCV 73.4* 03/19/2013   PLT 191 03/19/2013    Assessment / Plan: Transition phase  FHR stabilized while pt more flat on back with left tilt (position used during foley and attempted IUPC placement)- will leave pt in similar position and observe FHR closely Hope to be able to being pushing as dilation progresses and vtx descends   SHAW, KIMBERLY 03/21/2013, 1:26 AM

## 2013-03-21 NOTE — Transfer of Care (Signed)
Immediate Anesthesia Transfer of Care Note  Patient: Donna Knapp  Procedure(s) Performed: Procedure(s): Primary Cesarean Section Delivery Baby Boy @ [phone], Apgars 8/9 (N/A)  Patient Location: PACU  Anesthesia Type:Epidural  Level of Consciousness: awake, alert , oriented and patient cooperative  Airway & Oxygen Therapy: Patient Spontanous Breathing  Post-op Assessment: Report given to PACU RN and Post -op Vital signs reviewed and stable  Post vital signs: Reviewed and stable  Complications: No apparent anesthesia complications

## 2013-03-21 NOTE — Anesthesia Postprocedure Evaluation (Signed)
Anesthesia Post Note  Patient: Donna Knapp  Procedure(s) Performed: Procedure(s) (LRB): Primary Cesarean Section Delivery Baby Boy @ [phone], Apgars 8/9 (N/A)  Anesthesia type: Epidural  Patient location: Mother/Baby  Post pain: Pain level controlled  Post assessment: Post-op Vital signs reviewed  Last Vitals:  Filed Vitals:   03/21/13 1530  BP: 110/60  Pulse: 100  Temp: 36.9 C  Resp: 18    Post vital signs: Reviewed  Level of consciousness:alert  Complications: No apparent anesthesia complications

## 2013-03-21 NOTE — Lactation Note (Signed)
This note was copied from the chart of Donna Ellsie Stowell. Lactation Consultation Note     Initial consult with this mom and term baby, now 57 hours old. The baby has been spitting clear mucous all day. i passed a # 8 ng, and withdrew about 2 ml's clear, frothy liquid. The baby burped a couple of time. i attempted to keep him awake and latch him, but he was very sleepy. I hand expressed mom's breast, and was able to spoon feed about 2 mls of colostrum to the baby. I do not know if he swallowed this. He would not latch, so I left him skin to skin with mom, and told her to latch him if he wakes up/cues. Mom knows to call for assistance.   Patient Name: Donna Knapp [id number] Date: 03/21/2013 Reason for consult: Initial assessment   Maternal Data Formula Feeding for Exclusion: No Infant to breast within first hour of birth: No Breastfeeding delayed due to:: Maternal status Has patient been taught Hand Expression?: No Does the patient have breastfeeding experience prior to this delivery?: Yes  Feeding Feeding Type: Breast Fed Length of feed: 0 min  LATCH Score/Interventions Latch: Too sleepy or reluctant, no latch achieved, no sucking elicited. Intervention(s): Skin to skin;Waking techniques Intervention(s): Adjust position;Assist with latch;Breast massage;Breast compression  Audible Swallowing: None Intervention(s): Skin to skin;Hand expression  Type of Nipple: Everted at rest and after stimulation (left areola/nipple edematous/no bra/ eleated large br on pillow)  Comfort (Breast/Nipple): Soft / non-tender     Hold (Positioning): Assistance needed to correctly position infant at breast and maintain latch. Intervention(s): Skin to skin;Breastfeeding basics reviewed  LATCH Score: 5  Lactation Tools Discussed/Used     Consult Status Consult Status: Follow-up Date: 03/22/13 Follow-up type: In-patient    Alfred Levins 03/21/2013, 7:07 PM

## 2013-03-21 NOTE — Progress Notes (Signed)
Tramya Makailee Nudelman is a 32 y.o. [id number] at [redacted]w[redacted]d by LMP admitted for active labor  Subjective: Patient has progressed to complete dilation but vertex still at -2 or higher. The baby's FHR has become nonreassuring with repetetive variable decels with almost every contraction, and with pushing, with deceleration to 80-90's , lasting 30-50seconds.  Pelvic exam by me shows inadequate room for this infant, whose Thayer Ohm EFW by me approx 4500 gram.  Objective: BP 124/68  Pulse 138  Temp(Src) 99.6 F (37.6 C) (Axillary)  Resp 18  Ht 5\' 1"  (1.549 m)  Wt 85.276 kg (188 lb)  BMI 35.54 kg/m2  SpO2 100%  LMP 06/18/2012      FHT:  FHR: 145 bpm, variability: moderate,  accelerations:  Present,  decelerations:  Present prolonged, with recovery promptly UC:   regular, every 4 minutes SVE:   Dilation: 9 Effacement (%): 90 Station: -1;0 Exam by:: Philipp Deputy, CNM  Labs: Lab Results  Component Value Date   WBC 9.5 03/19/2013   HGB 11.4* 03/19/2013   HCT 31.8* 03/19/2013   MCV 73.4* 03/19/2013   PLT 191 03/19/2013    Assessment / Plan: Arrest in active phase of labor  Nonreassuring fetal status.  Labor: Pitocin discontinued Preeclampsia:   Fetal Wellbeing:  Category II Pain Control:  Epidural I/D:   Anticipated MOD:  Will require cesarean delivery.  Pt has been advised of recommentation of cesarean due to concern over fetal wellbeing.  Patient refuses to sign consent without counsel of husband who is not in building, allegedly 10 minutes away.. Will again discuss with husband upon his arrival.. fetal status has improved since pt no longer pushing. Type and cross ordered.  Arietta Eisenstein V 03/21/2013, 4:37 AM

## 2013-03-21 NOTE — Anesthesia Postprocedure Evaluation (Signed)
  Anesthesia Post Note  Patient: Donna Knapp  Procedure(s) Performed: Procedure(s) (LRB): Primary Cesarean Section Delivery Baby Boy @ [phone], Apgars 8/9 (N/A)  Anesthesia type: Epidural  Patient location: PACU  Post pain: Pain level controlled  Post assessment: Post-op Vital signs reviewed  Last Vitals:  Filed Vitals:   03/21/13 0730  BP:   Pulse: 107  Temp: 36.8 C  Resp: 22    Post vital signs: Reviewed  Level of consciousness: awake  Complications: No apparent anesthesia complications

## 2013-03-21 NOTE — Op Note (Signed)
  PATIENT:  Donna Knapp  31 y.o. female  PRE-OPERATIVE DIAGNOSIS:  Fetal Intolerance to Labor, Cephalopelvic disproportion  POST-OPERATIVE DIAGNOSIS:  Fetal Intolerance to Labor Cephalopelvic disproportion due to asynclytic presentation   PROCEDURE:  Procedure(s): Primary Cesarean Section Delivery Baby Boy @ [phone], Apgars 8/9 (N/A)  SURGEON:  Surgeon(s) and Role:    * Tilda Burrow, MD - Primary  Details of procedure:. Patient was taken to the operating room prepped and draped for lower abdominal surgery after consents obtained from patient and husband. Timeout was conducted, Ancef administered, and Betadine prep performed. Transverse lower incision was made and standard fashion a Pfannenstiel method. There were several large varicosities in the rectus muscle area requiring individual cautery. Peritoneum was opened the bladder was quite high on the well-developed lower uterine segment and cervical tissues. Bladder flap was developed, transverse incision made which was noted be at the level of the fetal year, with the baby lying markedly on the left side with the head essentially asynclitic, not entering the pelvis R. Lubell. Fetus was rotated into the incision and delivered easily cord clamp. Several varicosities on the patient's right side of the uterine incision which required ring forceps clamping. Uterine vessels were not interrupted. Placenta delivered easily and response to Pitocin and uterine massage and then the uterus was swabbed out, closed with running locking 0 chromic, then with an overlying suture layer of 0 chromic running continuous, then the bladder flap loosely reapproximated. Abdomen was seen to be well hemostatic with minimal fluid in the abdominal cavity was. Peritoneum was then closed anteriorly with running 2-0 Vicryl, the fascia closed with 0 Vicryl, the subcutaneous tissues approximated with interrupted 2-0 Vicryl x2 sutures and then subcuticular 4-0 Vicryl skin  closure completed the procedure sponge and needle counts completed the procedure and were correct

## 2013-03-21 NOTE — Preoperative (Signed)
Beta Blockers   Reason not to administer Beta Blockers:Not Applicable 

## 2013-03-21 NOTE — Brief Op Note (Signed)
03/19/2013 - 03/21/2013  5:55 AM  PATIENT:  Donna Knapp  32 y.o. female  PRE-OPERATIVE DIAGNOSIS:  Fetal Intolerance to Labor, Cephalopelvic disproportion  POST-OPERATIVE DIAGNOSIS:  Fetal Intolerance to Labor Cephalopelvic disproportion due to asynclytic presentation   PROCEDURE:  Procedure(s): Primary Cesarean Section Delivery Baby Boy @ 0518, Apgars 8/9 (N/A)  SURGEON:  Surgeon(s) and Role:    * Tilda Burrow, MD - Primary  PHYSICIAN ASSISTANT:   ASSISTANTS: none   ANESTHESIA:   epidural  EBL:  Total I/O In: 1600 [I.V.:1600] Out: 1300 [Urine:600; Blood:700]  BLOOD ADMINISTERED:none  DRAINS: Urinary Catheter (Foley)   LOCAL MEDICATIONS USED:  NONE  SPECIMEN:  Source of Specimen:  placenta to L&D  DISPOSITION OF SPECIMEN:  L&D  COUNTS:  YES  TOURNIQUET:  * No tourniquets in log *  DICTATION: .Dragon Dictation  PLAN OF CARE: has admit order  PATIENT DISPOSITION:  PACU - hemodynamically stable.   Delay start of Pharmacological VTE agent (>24hrs) due to surgical blood loss or risk of bleeding: not applicable

## 2013-03-22 ENCOUNTER — Encounter (HOSPITAL_COMMUNITY): Payer: Self-pay

## 2013-03-22 LAB — CBC
HCT: 26 % — ABNORMAL LOW (ref 36.0–46.0)
Hemoglobin: 9.2 g/dL — ABNORMAL LOW (ref 12.0–15.0)
MCHC: 35.4 g/dL (ref 30.0–36.0)
Platelets: 167 10*3/uL (ref 150–400)
RBC: 3.48 MIL/uL — ABNORMAL LOW (ref 3.87–5.11)

## 2013-03-22 NOTE — Lactation Note (Addendum)
This note was copied from the chart of Donna Zunaira Mcgillicuddy. Lactation Consultation Note  Patient Name: Donna Knapp [id number] Date: 03/22/2013 Reason for consult: Follow-up assessment [place] interpreter [phone] used for visit. Mom reports baby is not latching well to left breast. The aerola is thick, tough. Demonstrated to Mom how to use hand pump to pre-pump and soften aerola. Lots of colostrum present, aerola was more compressible but not as compressible as the right. Baby was able to latch and suckled about 5 minutes then fell asleep. Baby had recently fed on the right breast. Instructed Mom to pre-pump the left breast with hand pump for about 5 minutes, then attempt to latch baby using breast compression. Any EBM she receives with pre-pumping give back to baby. If baby cannot latch to left breast, she will need to pump to encourage milk production, protect milk supply. Advised to ask for assist as needed. RN advised of plan. Demonstrated how to clean hand pump to parents.   Maternal Data    Feeding Feeding Type: Breast Fed Length of feed: 5 min  LATCH Score/Interventions Latch: Repeated attempts needed to sustain latch, nipple held in mouth throughout feeding, stimulation needed to elicit sucking reflex. Intervention(s): Adjust position;Assist with latch;Breast massage;Breast compression  Audible Swallowing: None  Type of Nipple: Everted at rest and after stimulation (aerola thick, tough)  Comfort (Breast/Nipple): Soft / non-tender     Hold (Positioning): Assistance needed to correctly position infant at breast and maintain latch. Intervention(s): Breastfeeding basics reviewed;Support Pillows;Position options;Skin to skin  LATCH Score: 6  Lactation Tools Discussed/Used Tools: Pump Breast pump type: Manual   Consult Status Consult Status: Follow-up Date: 03/23/13 Follow-up type: In-patient    Donna Knapp 03/22/2013, 11:10 PM

## 2013-03-22 NOTE — Progress Notes (Signed)
Joellyn Haff CNM called and given report of patients low blood pressures. 0 new orders received at this time. Pt up earlier this evening and denied any dizziness or discomfort. Assessment remains unchanged from earlier assessment.

## 2013-03-23 ENCOUNTER — Other Ambulatory Visit: Payer: Medicaid Other

## 2013-03-23 ENCOUNTER — Encounter (HOSPITAL_COMMUNITY): Payer: Self-pay | Admitting: Obstetrics and Gynecology

## 2013-03-23 NOTE — Progress Notes (Signed)
Subjective: Postpartum Day 2: Cesarean Delivery Patient reports tolerating PO, + flatus, + BM and no problems voiding.    Objective: Vital signs in last 24 hours: Temp:  [98.1 F (36.7 C)-99 F (37.2 C)] 98.3 F (36.8 C) (10/20 0605) Pulse Rate:  [69-101] 99 (10/20 0605) Resp:  [18] 18 (10/20 0605) BP: (97-120)/(59-77) 120/77 mmHg (10/20 0605) SpO2:  [100 %] 100 % (10/19 0959)  Physical Exam:  General: alert, cooperative and no distress Lochia: appropriate Uterine Fundus: firm Incision: healing well, no significant drainage, no dehiscence, honeycomb in place  DVT Evaluation: No evidence of DVT seen on physical exam. No cords or calf tenderness. No significant calf/ankle edema.   Recent Labs  03/22/13 0638  HGB 9.2*  HCT 26.0*    Assessment/Plan: Status post Cesarean section. Doing well postoperatively.  Continue current care. Breastfeeding Depo for contraception  Loralei Radcliffe L 03/23/2013, 7:57 AM

## 2013-03-23 NOTE — Progress Notes (Signed)
Ur chart review completed.  

## 2013-03-24 MED ORDER — PRENATAL PLUS 27-1 MG PO TABS: 1.0000 | ORAL_TABLET | Freq: Every day | ORAL | Status: DC

## 2013-03-24 MED ORDER — OXYCODONE-ACETAMINOPHEN 5-325 MG PO TABS: 1.0000 | ORAL_TABLET | ORAL | Status: DC | PRN

## 2013-03-24 MED ORDER — IBUPROFEN 600 MG PO TABS: 600.0000 mg | ORAL_TABLET | Freq: Four times a day (QID) | ORAL | Status: DC

## 2013-03-24 NOTE — Discharge Summary (Signed)
Obstetric Discharge Summary Reason for Admission: induction of labor at 39 wk 1d indicated by A2 GDM, LGA  Prenatal Procedures: NST and ultrasound Intrapartum Procedures: cesarean: low cervical, transverse Postpartum Procedures: none Complications-Operative and Postpartum: none Hemoglobin  Date Value Range Status  03/22/2013 9.2* 12.0 - 15.0 g/dL Final     HCT  Date Value Range Status  03/22/2013 26.0* 36.0 - 46.0 % Final   Hospital Course: Patient was induced at 52 wk1d due to poorly controlled A1 GDM> EFW by [place] 9#5oz 1 wk prior to admission. She received cytotec and pitocin and progressed to complete dilatation but vtx at -2,   FHR decelerations occurred with pushing. Pitocin was discontinued and she proceeded to LTCS. Asynclitism was noted. Postoperatively there were no complications.   Husband used for interpretation (pt Amharic-speaking) . Plans OCPs for contraception after PP visit, breastfeed without supplementation and abstinence until then   Physical Exam:  General: alert, cooperative, appears stated age and no distress Lochia: appropriate Uterine Fundus: firm Incision: healing well, no significant drainage, no significant erythema Honeycomb dressing intact DVT Evaluation: No evidence of DVT seen on physical exam.  Discharge Diagnoses: Term Pregnancy-delivered A1/2 GDM  Discharge Information: Date: 03/24/2013 Activity: per booklet Diet: routine Medications: PNV, Ibuprofen and Percocet Condition: stable Instructions: refer to practice specific booklet Discharge to: home Follow-up Information   Follow up with [facility]. Schedule an appointment as soon as possible for a visit in 1 month. (Call for your appointment in 4-6 weekswks)    Specialty:  Obstetrics and Gynecology   Contact information:   [address] [place] [address] [phone]      Newborn Data: Live born female  Birth Weight: 7 lb 10.6 oz (3475 g) APGAR: 8, 9  Home with  mother.  Denajah Farias 03/24/2013, 7:12 PM

## 2013-03-26 ENCOUNTER — Other Ambulatory Visit: Payer: Medicaid Other

## 2013-03-26 NOTE — Discharge Summary (Signed)
Attestation of Attending Supervision of Advanced Practitioner (CNM/NP): Evaluation and management procedures were performed by the Advanced Practitioner under my supervision and collaboration.  I have reviewed the Advanced Practitioner's note and chart, and I agree with the management and plan.  HARRAWAY-SMITH, Celestina Gironda 11:33 AM     

## 2013-03-31 ENCOUNTER — Encounter (HOSPITAL_COMMUNITY): Payer: Self-pay

## 2013-04-08 ENCOUNTER — Ambulatory Visit: Payer: Medicaid Other | Admitting: Obstetrics & Gynecology

## 2013-04-08 ENCOUNTER — Telehealth: Payer: Self-pay | Admitting: *Deleted

## 2013-04-08 NOTE — Telephone Encounter (Signed)
Amilliana's  husband called and spoke with a nurse stating Ariza " is sick" and then asked for [place] to call with a translator. Called back with [facility] [phone] and husband answered- states Skyelar not available to talk to , but she is sick and he is trying to make an appointment.  States she is having a lot of pain and the pain medicine is not helping. Also states her incision in bleeding.  Advised him she needs to be seen and after discussing with provider he agreed to bring her today at 3pm to be evaluated in the clinic.

## 2013-04-29 ENCOUNTER — Ambulatory Visit (INDEPENDENT_AMBULATORY_CARE_PROVIDER_SITE_OTHER): Payer: Medicaid Other | Admitting: Obstetrics and Gynecology

## 2013-04-29 ENCOUNTER — Encounter: Payer: Self-pay | Admitting: Obstetrics and Gynecology

## 2013-04-29 MED ORDER — NORGESTIMATE-ETH ESTRADIOL 0.25-35 MG-MCG PO TABS: 1.0000 | ORAL_TABLET | Freq: Every day | ORAL | Status: DC

## 2013-04-29 NOTE — Progress Notes (Deleted)
Patient ID: Donna Knapp, female   DOB: [birth year], 32 y.o.   MRN: [id number]

## 2013-04-29 NOTE — Progress Notes (Signed)
  Subjective:     Donna Knapp is a 32 y.o. female who presents for a postpartum visit. She is 5 weeks postpartum following a low cervical transverse Cesarean section. I have fully reviewed the prenatal and intrapartum course. The delivery was at 39 gestational weeks. Outcome: primary cesarean section, low transverse incision. Anesthesia: epidural. Postpartum course has been uncomplicated. Baby's course has been uncomplicated. Baby is feeding by breast. Bleeding no bleeding. Bowel function is normal. Bladder function is normal. Patient is not sexually active. Contraception method is none. Postpartum depression screening: negative.     Review of Systems A comprehensive review of systems was negative.   Objective:    There were no vitals taken for this visit.  General:  alert, cooperative and no distress   Breasts:  inspection negative, no nipple discharge or bleeding, no masses or nodularity palpable  Lungs: clear to auscultation bilaterally  Heart:  regular rate and rhythm  Abdomen: soft, non-tender; bowel sounds normal; no masses,  no organomegaly and incision- healed   Vulva:  normal  Vagina: normal vagina, no discharge, exudate, lesion, or erythema  Cervix:  multiparous appearance  Corpus: normal size, contour, position, consistency, mobility, non-tender  Adnexa:  no mass, fullness, tenderness  Rectal Exam: Not performed.        Assessment:     Normal postpartum exam. Pap smear not done at today's visit.   Plan:    1. Contraception: OCP (estrogen/progesterone) 2. Patient due for repeat pap smear in May 2015 3. Patient with gestational diabetes in pregnancy and will need 2 hr glucose test 4. Follow up in: 1 week for glucola challenge test and in 3 months for BP check or as needed.

## 2013-05-04 ENCOUNTER — Other Ambulatory Visit: Payer: Medicaid Other

## 2013-05-04 DIAGNOSIS — O24419 Gestational diabetes mellitus in pregnancy, unspecified control: Secondary | ICD-10-CM

## 2013-05-04 LAB — GLUCOSE TOLERANCE, 2 HOURS
Glucose, 2 hour: 94 mg/dL (ref 70–139)
Glucose, Fasting: 84 mg/dL (ref 70–99)

## 2013-05-05 ENCOUNTER — Encounter: Payer: Self-pay | Admitting: Family

## 2013-05-05 ENCOUNTER — Other Ambulatory Visit: Payer: Medicaid Other

## 2014-02-25 IMAGING — US US OB FOLLOW-UP
1 series · 12 of 28 positions shown · non-contrast
Comparison: none

[Series 1: us ob follow up · 12 of 54 slices shown]
[im 2/54]
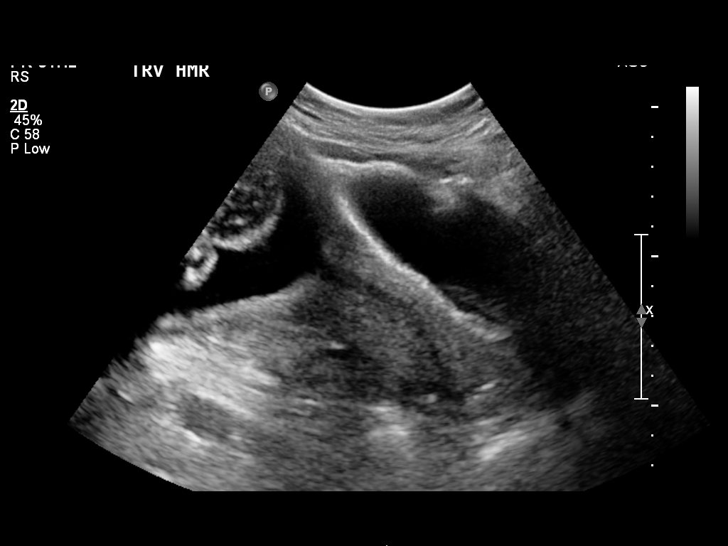
[im 6/54]
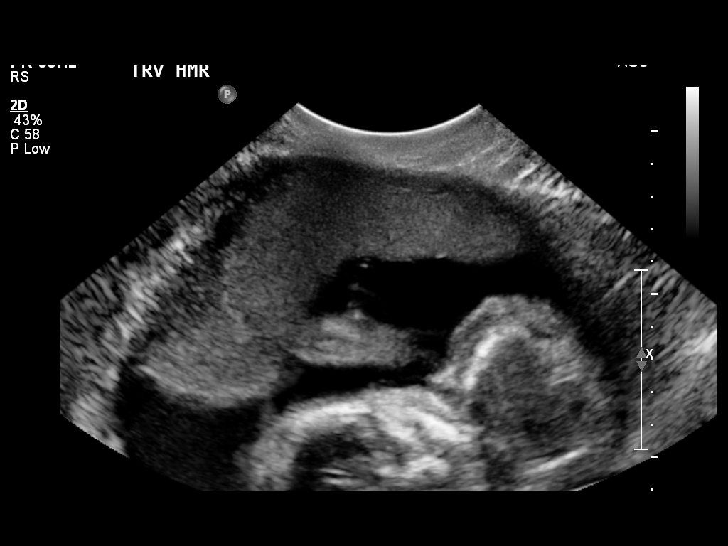
[im 10/54]
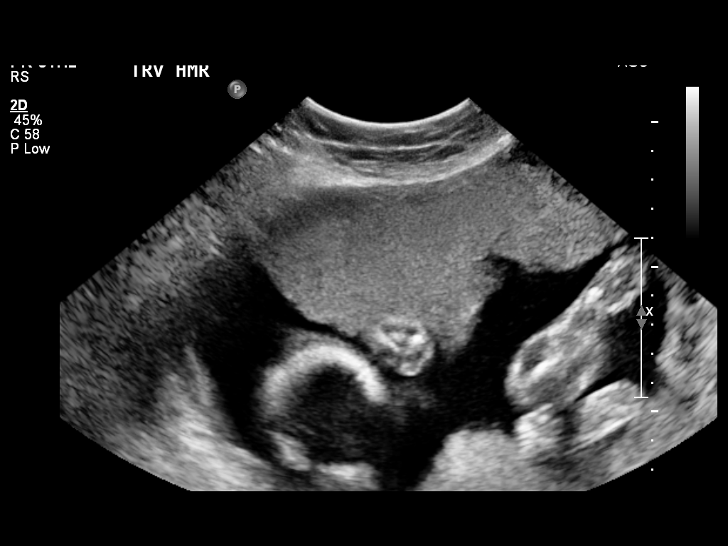
[im 16/54]
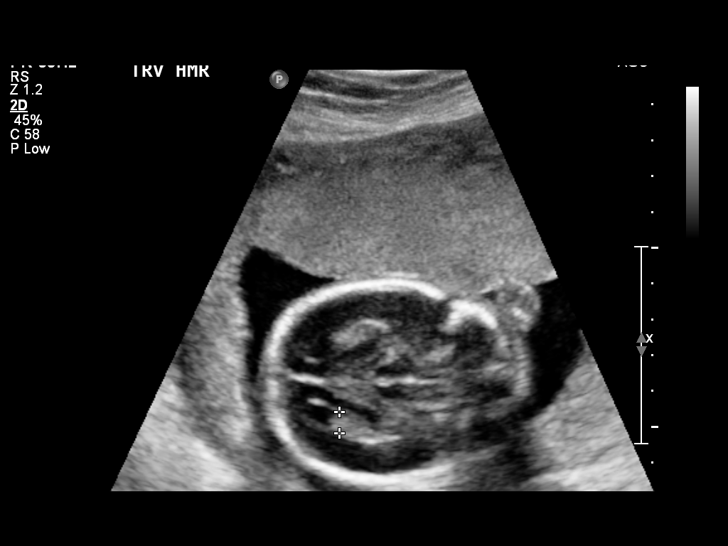
[im 20/54]
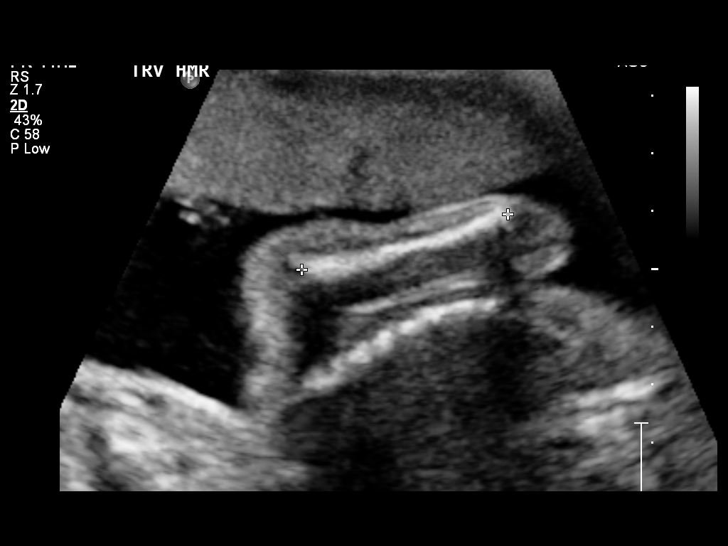
[im 24/54]
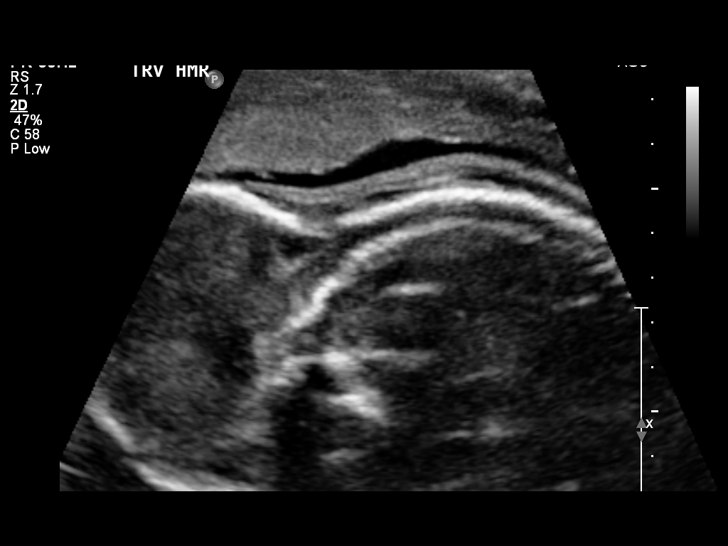
[im 30/54]
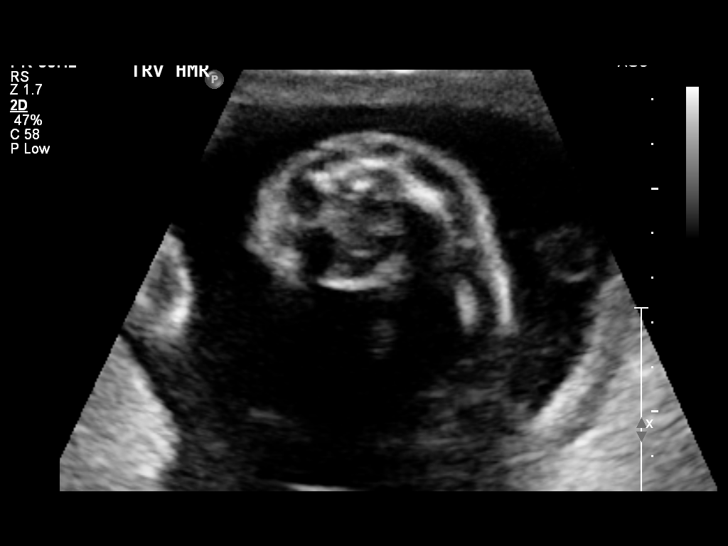
[im 34/54]
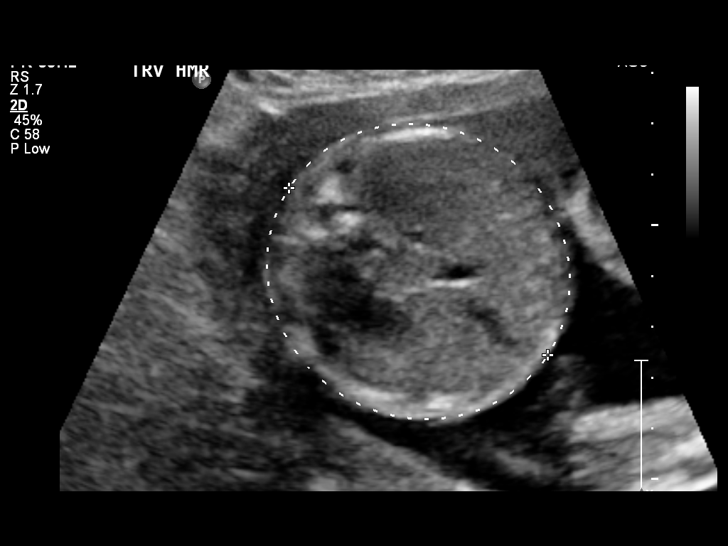
[im 38/54]
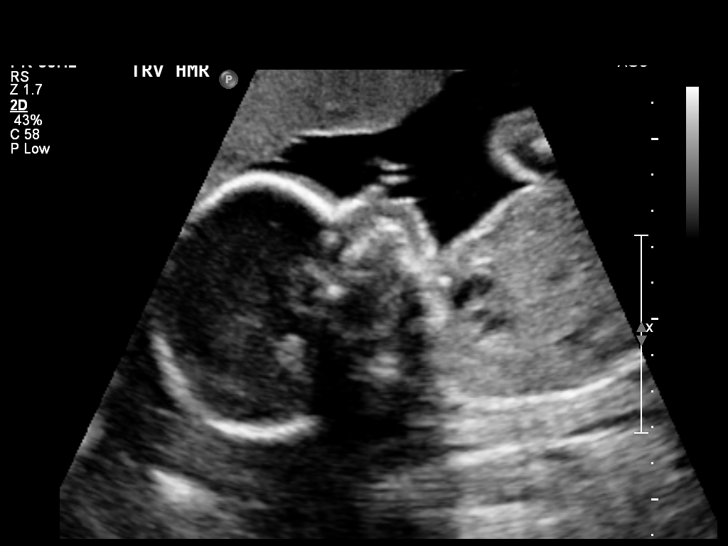
[im 44/54]
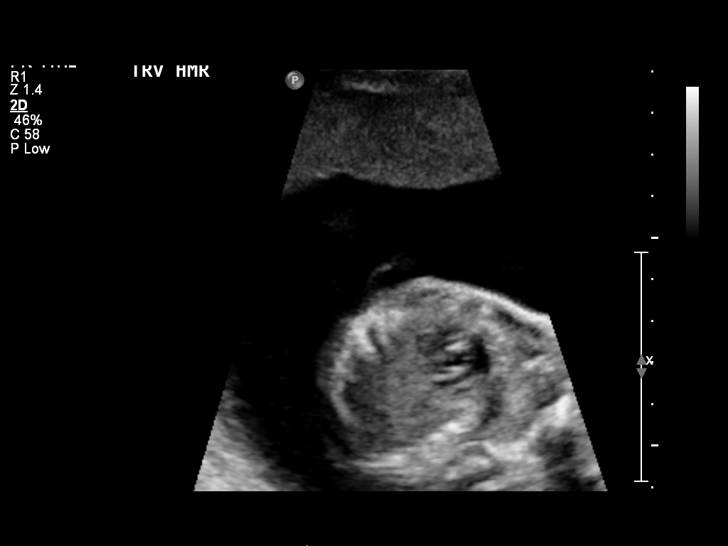
[im 48/54]
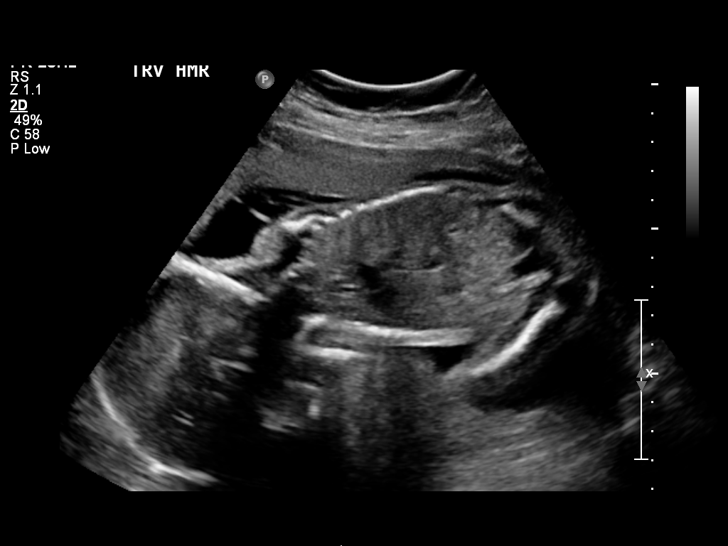
[im 52/54]
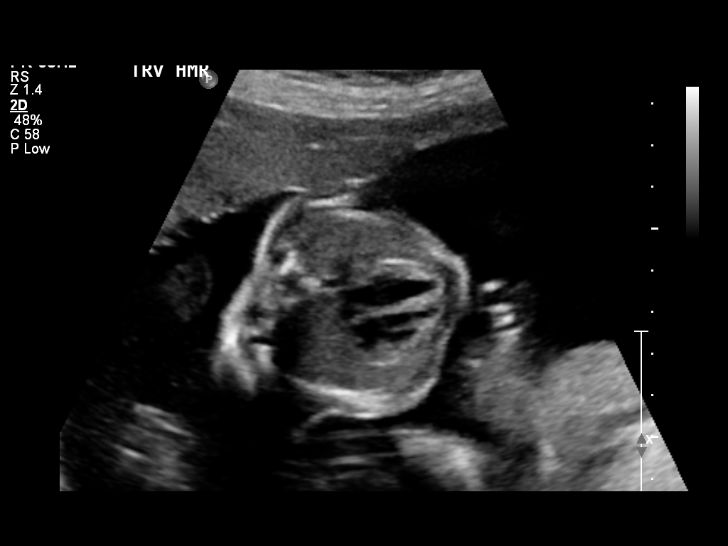

[12 of 28 positions shown; findings below may reference images not displayed]

OBSTETRICS REPORT
                      (Signed Final 11/19/2012 [DATE])

Service(s) Provided

 US OB FOLLOW UP                                       76816.1
Indications

 Follow-up incomplete fetal anatomic evaluation
 No or Little Prenatal Care
Fetal Evaluation

 Num Of Fetuses:    1
 Fetal Heart Rate:  147                         bpm
 Cardiac Activity:  Observed
 Presentation:      Transverse, head to
                    maternal right
 Placenta:          Anterior, above cervical os
 P. Cord            Visualized, central
 Insertion:

 Amniotic Fluid
 AFI FV:      Subjectively within normal limits
                                             Larg Pckt:   4.64   cm
Biometry

 BPD:     55.7  mm    G. Age:   23w 0d                CI:         69.6   70 - 86
                                                      FL/HC:      19.1   18.4 -

 HC:     213.1  mm    G. Age:   23w 3d       88  %    HC/AC:      1.17   1.06 -

 AC:     182.1  mm    G. Age:   23w 0d       75  %    FL/BPD:     73.2   71 - 87
 FL:      40.8  mm    G. Age:   23w 2d       78  %    FL/AC:      22.4   20 - 24
 HUM:     36.3  mm    G. Age:   22w 5d       65  %

 Est. FW:     566  gm      1 lb 4 oz     64  %
Gestational Age

 LMP:           22w 0d       Date:   06/18/12                 EDD:   03/25/13
 U/S Today:     23w 1d                                        EDD:   03/17/13
 Best:          22w 0d    Det. By:   LMP  (06/18/12)          EDD:   03/25/13
Anatomy

 Cranium:          Appears normal         Aortic Arch:      Basic anatomy
                                                            exam per order
 Fetal Cavum:      Previously seen        Ductal Arch:      Basic anatomy
                                                            exam per order
 Ventricles:       Appears normal         Diaphragm:        Previously seen
 Choroid Plexus:   Previously seen        Stomach:          Appears normal, left
                                                            sided
 Cerebellum:       Previously seen        Abdomen:          Appears normal
 Posterior Fossa:  Previously seen        Abdominal Wall:   Appears nml (cord
                                                            insert, abd wall)
 Nuchal Fold:      Not applicable (>20    Cord Vessels:     Previously seen
                   wks GA)
 Face:             Orbits and profile     Kidneys:          Appear normal
                   previously seen
 Lips:             Previously seen        Bladder:          Appears normal
 Heart:            Previously seen        Spine:            Appears normal
 RVOT:             Previously seen        Lower             Previously seen
                                          Extremities:
 LVOT:             Appears normal         Upper             Previously seen
                                          Extremities:

 Other:  Male gender. Technically difficult due to fetal position.
Cervix Uterus Adnexa

 Cervical Length:   5.96      cm

 Cervix:       Normal appearance by transabdominal scan.
 Uterus:       No abnormality visualized.
 Cul De Sac:   No free fluid seen.
 Left Ovary:   Within normal limits.
 Right Ovary:  Not visualized.
 Adnexa:     No abnormality visualized.
Impression

 Single live IUP in transverse, head maternal right,
 presentation.
 Concordant measurements/assigned GA by LMP.
 No anomaly seen in visualized structures as listed above.

## 2014-04-05 ENCOUNTER — Encounter: Payer: Self-pay | Admitting: Obstetrics and Gynecology

## 2014-06-17 IMAGING — US US OB FOLLOW-UP
1 series · 12 of 28 positions shown · non-contrast
Comparison: none

[Series 1: us ob follow up · 12 of 31 slices shown]
[im 2/31]
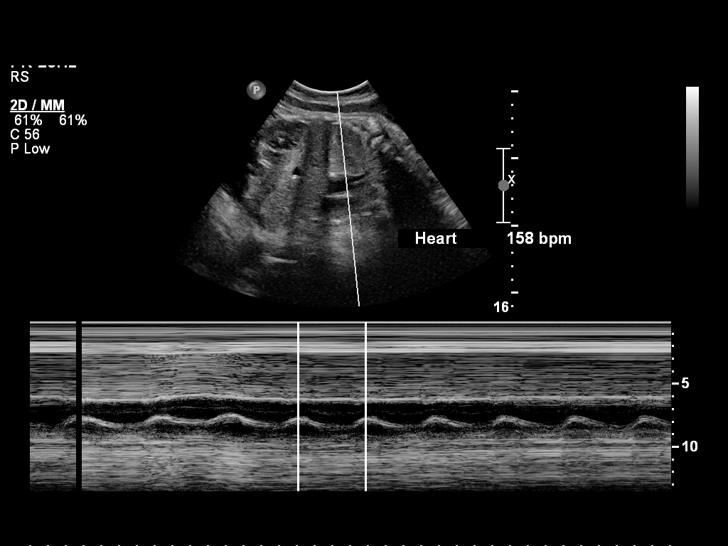
[im 4/31]
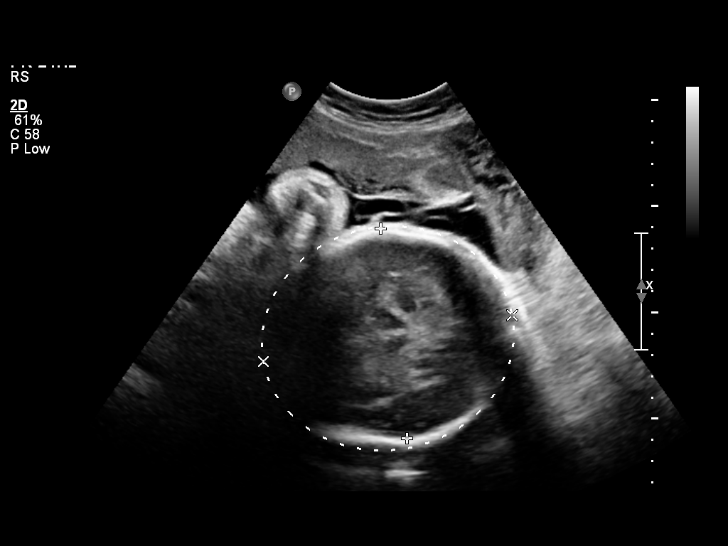
[im 6/31]
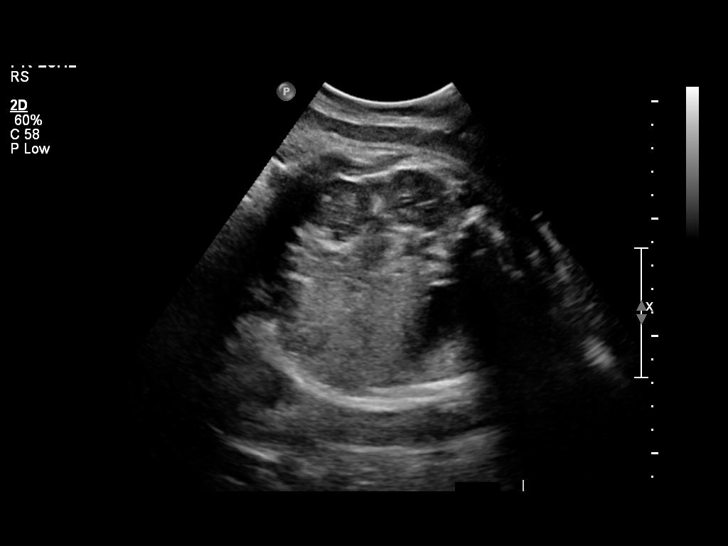
[im 9/31]
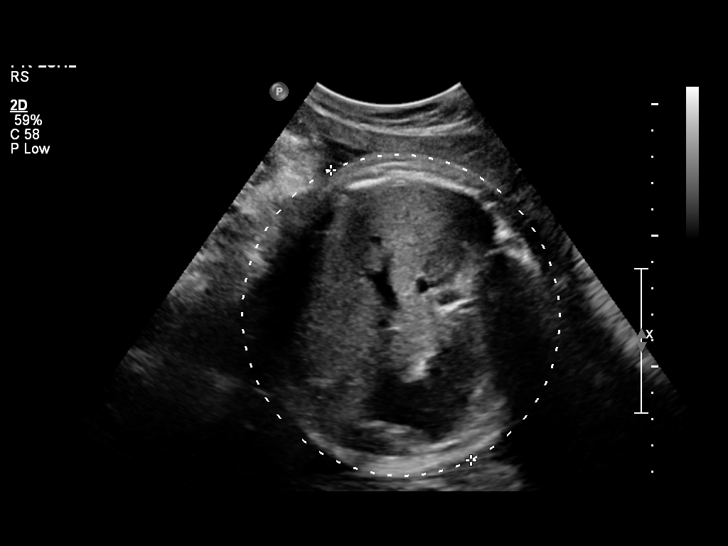
[im 12/31]
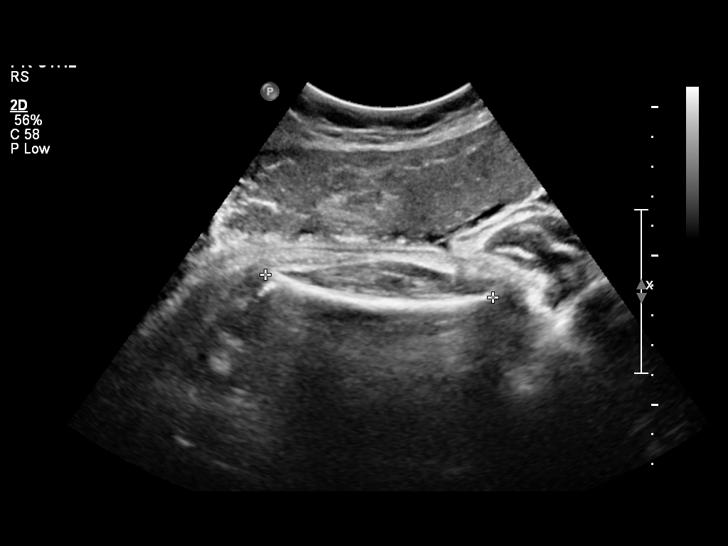
[im 14/31]
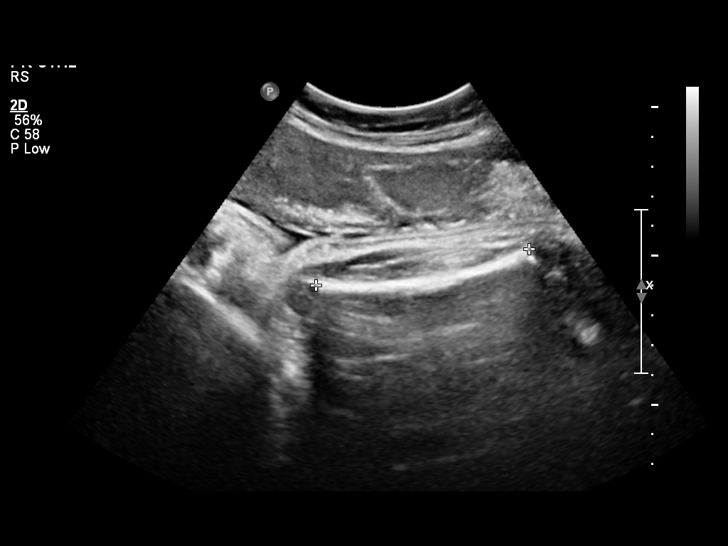
[im 17/31]
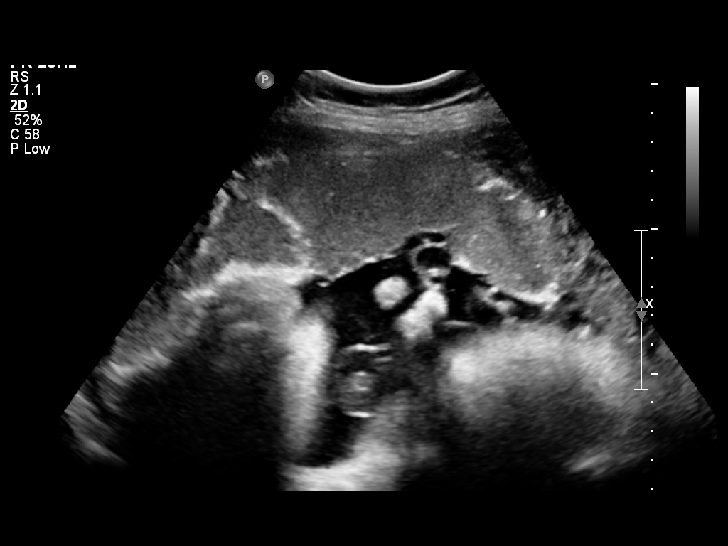
[im 19/31]
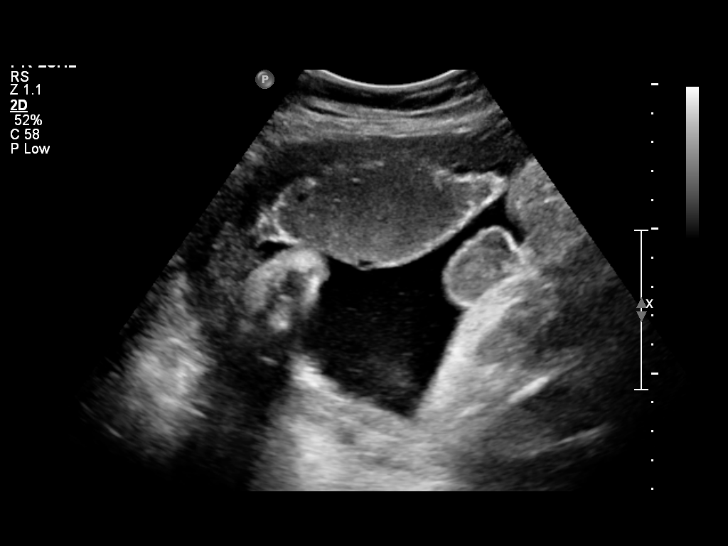
[im 22/31]
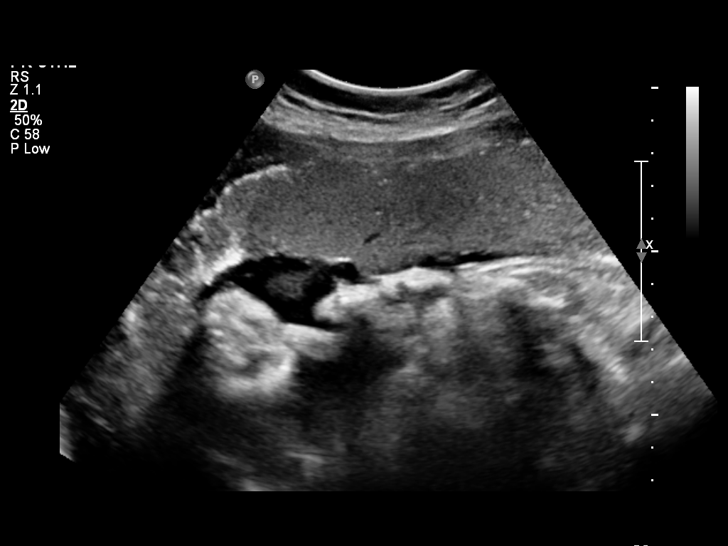
[im 25/31]
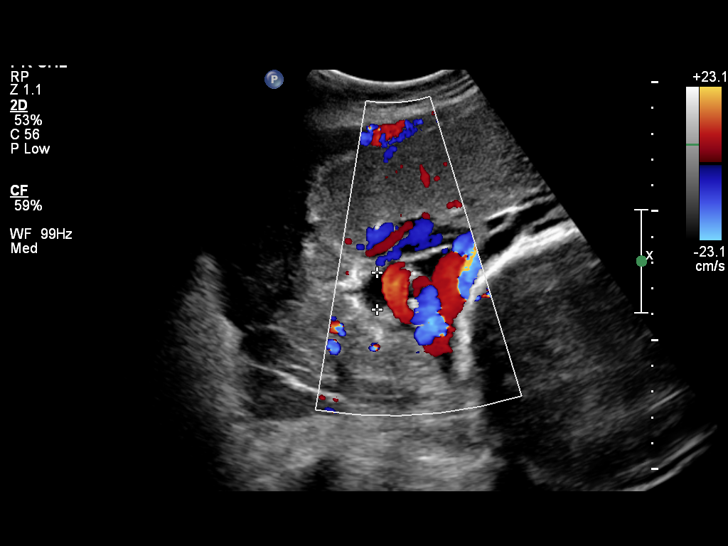
[im 27/31]
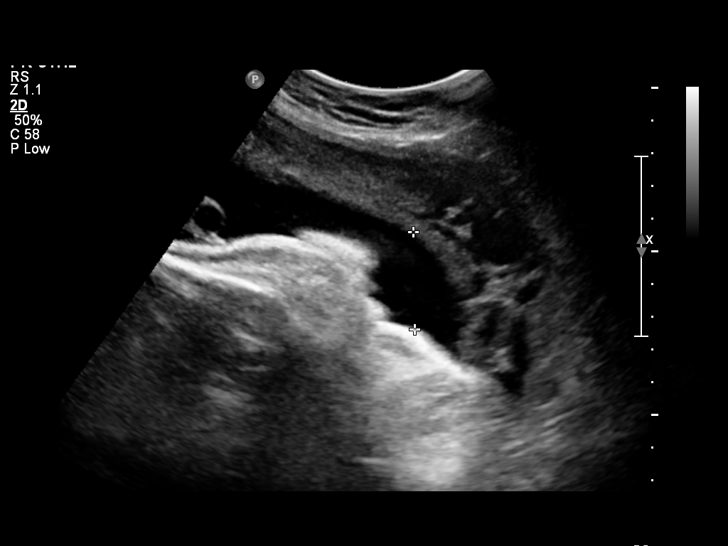
[im 29/31]
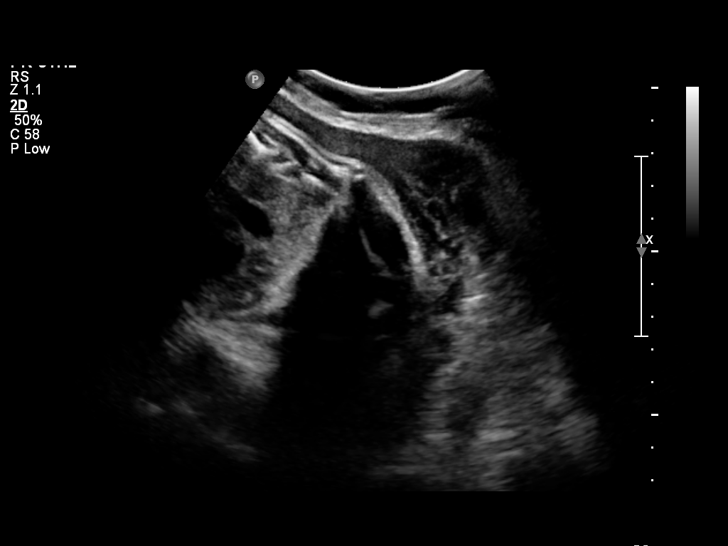

[12 of 28 positions shown; findings below may reference images not displayed]

OBSTETRICS REPORT
                      (Signed Final 03/11/2013 [DATE])

Service(s) Provided

 US OB FOLLOW UP                                       76816.1
Indications

 No or Little Prenatal Care
 Diabetes - Gestational, A1 (diet controlled)
Fetal Evaluation

 Num Of Fetuses:    1
 Fetal Heart Rate:  158                          bpm
 Cardiac Activity:  Observed
 Presentation:      Cephalic
 Placenta:          Anterior, above cervical os

 Amniotic Fluid
 AFI FV:      Subjectively within normal limits
 AFI Sum:     12.2    cm       42  %Tile     Larg Pckt:    5.85  cm
 RUQ:   5.85    cm   RLQ:    1.43   cm    LUQ:   1.95    cm   LLQ:    2.97   cm
Biometry

 BPD:     98.7  mm     G. Age:  40w 3d                CI:        78.01   70 - 86
                                                      FL/HC:      20.9   20.9 -

 HC:     353.6  mm     G. Age:  41w 2d       93  %    HC/AC:      0.93   0.92 -

 AC:     378.5  mm     G. Age:  41w 5d     > 97  %    FL/BPD:     74.9   71 - 87
 FL:      73.9  mm     G. Age:  37w 6d       48  %    FL/AC:      19.5   20 - 24

 Est. FW:    2093  gm      9 lb 5 oz   > 90  %
Gestational Age

 LMP:           38w 0d        Date:  06/18/12                 EDD:   03/25/13
 U/S Today:     40w 2d                                        EDD:   03/09/13
 Best:          38w 0d     Det. By:  LMP  (06/18/12)          EDD:   03/25/13
Anatomy

 Cranium:          Appears normal         Aortic Arch:      Not well visualized
 Fetal Cavum:      Previously seen        Ductal Arch:      Not well visualized
 Ventricles:       Previously seen        Diaphragm:        Previously seen
 Choroid Plexus:   Previously seen        Stomach:          Appears normal, left
                                                            sided
 Cerebellum:       Previously seen        Abdomen:          Appears normal
 Posterior Fossa:  Previously seen        Abdominal Wall:   Previously seen
 Nuchal Fold:      Not applicable (>20    Cord Vessels:     Previously seen
                   wks GA)
 Face:             Orbits and profile     Kidneys:          Appear normal
                   previously seen
 Lips:             Previously seen        Bladder:          Appears normal
 Heart:            Previously seen        Spine:            Previously seen
 RVOT:             Previously seen        Lower             Previously seen
                                          Extremities:
 LVOT:             Previously seen        Upper             Previously seen
                                          Extremities:

 Other:  Male gender previously seen. Technically difficult due to advanced
         GA and fetal position.
Cervix Uterus Adnexa

 Cervix:       Not visualized (advanced GA >06wks)
 Uterus:       No abnormality visualized.
 Left Ovary:    Not visualized.
 Right Ovary:   Not visualized.
Impression

 Single IUP at 38 0/7 weeks
 A1 GDM
 Estimated fetal weight > 90th %tile (2093 g); the AC
 measures > 97th %tile
 Normal amniotic fluid volume
Recommendations

 Given fetal macrosomia, would suspect that glucose control
 is less than ideal and would manage similar to an A2
 gestataional diabetes
 Recommend 2x weekly NSTs and delivery at 39 weeks
 Although she is still a candidate for a vaginal delivery - would
 have a low threshold for cesarean delivery if the patient falls
 off the labor curve.  Would avoid operative vaginal delivery.

## 2015-05-20 ENCOUNTER — Other Ambulatory Visit (HOSPITAL_COMMUNITY): Payer: Self-pay | Admitting: Chiropractic Medicine

## 2015-05-20 ENCOUNTER — Ambulatory Visit (HOSPITAL_COMMUNITY)
Admission: RE | Admit: 2015-05-20 | Discharge: 2015-05-20 | Disposition: A | Discharge status: Home / Self Care | Payer: Medicaid Other | Source: Ambulatory Visit | Attending: Chiropractic Medicine | Admitting: Chiropractic Medicine

## 2015-05-20 DIAGNOSIS — M549 Dorsalgia, unspecified: Principal | ICD-10-CM

## 2015-05-20 DIAGNOSIS — G8929 Other chronic pain: Secondary | ICD-10-CM

## 2015-05-20 DIAGNOSIS — M542 Cervicalgia: Secondary | ICD-10-CM | POA: Insufficient documentation

## 2015-06-30 ENCOUNTER — Encounter: Payer: Self-pay | Admitting: Certified Nurse Midwife

## 2015-06-30 ENCOUNTER — Ambulatory Visit (INDEPENDENT_AMBULATORY_CARE_PROVIDER_SITE_OTHER): Payer: Medicaid Other | Admitting: Certified Nurse Midwife

## 2015-06-30 VITALS — BP 114/75 | HR 92 | Wt 197.0 lb

## 2015-06-30 DIAGNOSIS — B9689 Other specified bacterial agents as the cause of diseases classified elsewhere: Secondary | ICD-10-CM

## 2015-06-30 DIAGNOSIS — B3731 Acute candidiasis of vulva and vagina: Secondary | ICD-10-CM

## 2015-06-30 DIAGNOSIS — N760 Acute vaginitis: Secondary | ICD-10-CM

## 2015-06-30 DIAGNOSIS — Z0000 Encounter for general adult medical examination without abnormal findings: Secondary | ICD-10-CM

## 2015-06-30 DIAGNOSIS — Z01419 Encounter for gynecological examination (general) (routine) without abnormal findings: Secondary | ICD-10-CM | POA: Diagnosis not present

## 2015-06-30 DIAGNOSIS — B373 Candidiasis of vulva and vagina: Secondary | ICD-10-CM

## 2015-06-30 DIAGNOSIS — Z113 Encounter for screening for infections with a predominantly sexual mode of transmission: Secondary | ICD-10-CM

## 2015-06-30 LAB — CBC WITH DIFFERENTIAL/PLATELET
BASOS ABS: 0 10*3/uL (ref 0.0–0.1)
Basophils Relative: 0 % (ref 0–1)
EOS ABS: 0.8 10*3/uL — AB (ref 0.0–0.7)
Eosinophils Relative: 10 % — ABNORMAL HIGH (ref 0–5)
HEMATOCRIT: 38.8 % (ref 36.0–46.0)
Hemoglobin: 13.9 g/dL (ref 12.0–15.0)
LYMPHS ABS: 2.7 10*3/uL (ref 0.7–4.0)
LYMPHS PCT: 32 % (ref 12–46)
MCH: 28.5 pg (ref 26.0–34.0)
MCHC: 35.8 g/dL (ref 30.0–36.0)
MCV: 79.7 fL (ref 78.0–100.0)
MPV: 10.6 fL (ref 8.6–12.4)
Monocytes Absolute: 0.6 10*3/uL (ref 0.1–1.0)
Monocytes Relative: 7 % (ref 3–12)
NEUTROS ABS: 4.2 10*3/uL (ref 1.7–7.7)
Neutrophils Relative %: 51 % (ref 43–77)
PLATELETS: 216 10*3/uL (ref 150–400)
RBC: 4.87 MIL/uL (ref 3.87–5.11)
RDW: 14.3 % (ref 11.5–15.5)
WBC: 8.3 10*3/uL (ref 4.0–10.5)

## 2015-06-30 MED ORDER — METRONIDAZOLE 500 MG PO TABS: 500.0000 mg | ORAL_TABLET | Freq: Two times a day (BID) | ORAL | Status: DC

## 2015-06-30 MED ORDER — FLUCONAZOLE 100 MG PO TABS: 100.0000 mg | ORAL_TABLET | Freq: Once | ORAL | Status: DC

## 2015-06-30 MED ORDER — TERCONAZOLE 0.4 % VA CREA: 1.0000 | TOPICAL_CREAM | Freq: Every day | VAGINAL | Status: DC

## 2015-06-30 NOTE — Progress Notes (Signed)
Patient ID: Donna Knapp, female   DOB: 14-Jan-1981, 35 y.o.   MRN: [id number]    Subjective:      Donna Knapp is a 35 y.o. female here for a routine exam.  Current complaints: none.  Periods are monthly, lasting 4 days, denies any heavy bleeding, clots or cramping.  Here with spouse.   From [place], has been here for 4 years.  Spouse is Nurse, learning disability.  Desires full STD screening.    Personal health questionnaire:  Is patient Ashkenazi Jewish, have a family history of breast and/or ovarian cancer: no Is there a family history of uterine cancer diagnosed at age < 66, gastrointestinal cancer, urinary tract cancer, family member who is a Personnel officer syndrome-associated carrier: no Is the patient overweight and hypertensive, family history of diabetes, personal history of gestational diabetes, preeclampsia or PCOS: yes Is patient over 55, have PCOS,  family history of premature CHD under age 61, diabetes, smoke, have hypertension or peripheral artery disease:  no At any time, has a partner hit, kicked or otherwise hurt or frightened you?: no Over the past 2 weeks, have you felt down, depressed or hopeless?: no Over the past 2 weeks, have you felt little interest or pleasure in doing things?:no   Gynecologic History Patient's last menstrual period was 06/14/2015. Contraception: none Last Pap: unknown. Results were: normal Last mammogram: N/A.   Obstetric History OB History  Gravida Para Term Preterm AB SAB TAB Ectopic Multiple Living  0 1 1 0 0 0 5    # Outcome Date GA Lbr Len/2nd Weight Sex Delivery Anes PTL Lv  6 Term 03/21/13 [redacted]w[redacted]d 28:00 / 02:18 7 lb 10.6 oz (3.475 kg) M CS-LTranv EPI  Y  5 Term 02/01/07    F Vag-Spont     4 Term 05/16/05    F Vag-Spont     3 Term 01/21/01    M Vag-Spont     2 Term 02/05/96    F Vag-Spont   Y  1 SAB               Past Medical History  Diagnosis Date  . Gestational diabetes     Past Surgical History  Procedure Laterality Date   . Cesarean section N/A 03/21/2013    Procedure: Primary Cesarean Section Delivery Baby Boy @ 0518, Apgars 8/9;  Surgeon: Tilda Burrow, MD;  Location: [facility];  Service: Obstetrics;  Laterality: N/A;     Current outpatient prescriptions:  .  fluconazole (DIFLUCAN) 100 MG tablet, Take 1 tablet (100 mg total) by mouth once. Repeat dose in 48-72 hour., Disp: 3 tablet, Rfl: 0 .  ibuprofen (ADVIL,MOTRIN) 600 MG tablet, Take 1 tablet (600 mg total) by mouth every 6 (six) hours. (Patient not taking: Reported on 06/30/2015), Disp: 30 tablet, Rfl: 0 .  metroNIDAZOLE (FLAGYL) 500 MG tablet, Take 1 tablet (500 mg total) by mouth 2 (two) times daily., Disp: 14 tablet, Rfl: 0 .  terconazole (TERAZOL 7) 0.4 % vaginal cream, Place 1 applicator vaginally at bedtime., Disp: 45 g, Rfl: 0 No Known Allergies  Social History  Substance Use Topics  . Smoking status: Never Smoker   . Smokeless tobacco: Never Used  . Alcohol Use: No    History reviewed. No pertinent family history.    Review of Systems  Constitutional: negative for fatigue and weight loss Respiratory: negative for cough and wheezing Cardiovascular: negative for chest pain, fatigue and palpitations Gastrointestinal: negative for abdominal pain and  change in bowel habits Musculoskeletal:negative for myalgias Neurological: negative for gait problems and tremors Behavioral/Psych: negative for abusive relationship, depression Endocrine: negative for temperature intolerance   Genitourinary:negative for abnormal menstrual periods, genital lesions, hot flashes, sexual problems and vaginal discharge Integument/breast: negative for breast lump, breast tenderness, nipple discharge and skin lesion(s)    Objective:       BP 114/75 mmHg  Pulse 92  Wt 197 lb (89.359 kg)  LMP 06/14/2015 General:   alert  Skin:   no rash or abnormalities  Lungs:   clear to auscultation bilaterally  Heart:   regular rate and rhythm, S1, S2 normal, no murmur,  click, rub or gallop  Breasts:   normal without suspicious masses, skin or nipple changes or axillary nodes  Abdomen:  normal findings: no organomegaly, soft, non-tender and no hernia  obese  Pelvis:  External genitalia: normal general appearance Urinary system: urethral meatus normal and bladder without fullness, nontender Vaginal: normal without tenderness, induration or masses, + thin gray vaginal discharge, + chunky white discharge Cervix: normal appearance Adnexa: normal bimanual exam Uterus: anteverted and non-tender, normal size   Lab Review Urine pregnancy test Labs reviewed yes Radiologic studies reviewed no  50% of 30 min visit spent on counseling and coordination of care.   Assessment:    Healthy female exam.   Non-english speaking  obese  BV  Yeast vaginitis  STD screening exam  Plan:    Education reviewed: depression evaluation, low fat, low cholesterol diet, safe sex/STD prevention, self breast exams, skin cancer screening and weight bearing exercise. Contraception: none. Follow up in: 1 year.   Meds ordered this encounter  Medications  . fluconazole (DIFLUCAN) 100 MG tablet    Sig: Take 1 tablet (100 mg total) by mouth once. Repeat dose in 48-72 hour.    Dispense:  3 tablet    Refill:  0  . terconazole (TERAZOL 7) 0.4 % vaginal cream    Sig: Place 1 applicator vaginally at bedtime.    Dispense:  45 g    Refill:  0  . metroNIDAZOLE (FLAGYL) 500 MG tablet    Sig: Take 1 tablet (500 mg total) by mouth 2 (two) times daily.    Dispense:  14 tablet    Refill:  0   Orders Placed This Encounter  Procedures  . SureSwab, Vaginosis/Vaginitis Plus  . HIV antibody (with reflex)  . Hepatitis B surface antigen  . RPR  . Hepatitis C antibody  . CBC with Differential/Platelet   Need to obtain previous records Possible management options include: nutrition consult

## 2015-07-01 LAB — HEPATITIS C ANTIBODY: HCV Ab: NEGATIVE

## 2015-07-01 LAB — HEPATITIS B SURFACE ANTIGEN: Hepatitis B Surface Ag: NEGATIVE

## 2015-07-01 LAB — RPR

## 2015-07-01 LAB — HIV ANTIBODY (ROUTINE TESTING W REFLEX): HIV 1&2 Ab, 4th Generation: NONREACTIVE

## 2015-07-04 LAB — PAP, TP IMAGING W/ HPV RNA, RFLX HPV TYPE 16,18/45: HPV MRNA, HIGH RISK: NOT DETECTED

## 2015-07-05 LAB — SURESWAB, VAGINOSIS/VAGINITIS PLUS
Atopobium vaginae: NOT DETECTED Log (cells/mL)
C. TRACHOMATIS RNA, TMA: NOT DETECTED
C. TROPICALIS, DNA: NOT DETECTED
C. albicans, DNA: NOT DETECTED
C. glabrata, DNA: NOT DETECTED
C. parapsilosis, DNA: NOT DETECTED
Gardnerella vaginalis: NOT DETECTED Log (cells/mL)
LACTOBACILLUS SPECIES: 7.7 Log (cells/mL)
MEGASPHAERA SPECIES: NOT DETECTED Log (cells/mL)
N. gonorrhoeae RNA, TMA: NOT DETECTED
T. VAGINALIS RNA, QL TMA: NOT DETECTED

## 2016-01-07 ENCOUNTER — Emergency Department (HOSPITAL_COMMUNITY)
Admission: EM | Admit: 2016-01-07 | Discharge: 2016-01-08 | Disposition: A | Discharge status: Home / Self Care | Payer: BLUE CROSS/BLUE SHIELD | Attending: Emergency Medicine | Admitting: Emergency Medicine

## 2016-01-07 ENCOUNTER — Encounter (HOSPITAL_COMMUNITY): Payer: Self-pay | Admitting: Emergency Medicine

## 2016-01-07 DIAGNOSIS — R600 Localized edema: Secondary | ICD-10-CM | POA: Diagnosis not present

## 2016-01-07 DIAGNOSIS — R609 Edema, unspecified: Secondary | ICD-10-CM

## 2016-01-07 LAB — COMPREHENSIVE METABOLIC PANEL
ALT: 21 U/L (ref 14–54)
AST: 16 U/L (ref 15–41)
Albumin: 3.5 g/dL (ref 3.5–5.0)
Alkaline Phosphatase: 60 U/L (ref 38–126)
Anion gap: 6 (ref 5–15)
BILIRUBIN TOTAL: 0.5 mg/dL (ref 0.3–1.2)
BUN: 7 mg/dL (ref 6–20)
CHLORIDE: 108 mmol/L (ref 101–111)
CO2: 24 mmol/L (ref 22–32)
CREATININE: 0.62 mg/dL (ref 0.44–1.00)
Calcium: 9.2 mg/dL (ref 8.9–10.3)
GFR calc Af Amer: >60 mL/min (ref >60)
Glucose, Bld: 115 mg/dL — ABNORMAL HIGH (ref 65–99)
Potassium: 3.8 mmol/L (ref 3.5–5.1)
Sodium: 138 mmol/L (ref 135–145)
TOTAL PROTEIN: 7.2 g/dL (ref 6.5–8.1)

## 2016-01-07 LAB — CBC WITH DIFFERENTIAL/PLATELET
Basophils Absolute: 0 10*3/uL (ref 0.0–0.1)
Basophils Relative: 0 %
EOS PCT: 11 %
Eosinophils Absolute: 0.9 10*3/uL — ABNORMAL HIGH (ref 0.0–0.7)
HCT: 35.7 % — ABNORMAL LOW (ref 36.0–46.0)
Hemoglobin: 12.6 g/dL (ref 12.0–15.0)
LYMPHS ABS: 2.5 10*3/uL (ref 0.7–4.0)
LYMPHS PCT: 31 %
MCH: 28.2 pg (ref 26.0–34.0)
MCHC: 35.3 g/dL (ref 30.0–36.0)
MCV: 79.9 fL (ref 78.0–100.0)
MONO ABS: 0.7 10*3/uL (ref 0.1–1.0)
Monocytes Relative: 8 %
Neutro Abs: 3.9 10*3/uL (ref 1.7–7.7)
Neutrophils Relative %: 50 %
Platelets: 207 10*3/uL (ref 150–400)
RBC: 4.47 MIL/uL (ref 3.87–5.11)
RDW: 12.8 % (ref 11.5–15.5)
WBC: 7.9 10*3/uL (ref 4.0–10.5)

## 2016-01-07 LAB — URINALYSIS, ROUTINE W REFLEX MICROSCOPIC
BILIRUBIN URINE: NEGATIVE
GLUCOSE, UA: NEGATIVE mg/dL
Hgb urine dipstick: NEGATIVE
Ketones, ur: NEGATIVE mg/dL
Leukocytes, UA: NEGATIVE
NITRITE: NEGATIVE
PH: 5 (ref 5.0–8.0)
Protein, ur: NEGATIVE mg/dL
SPECIFIC GRAVITY, URINE: 1.026 (ref 1.005–1.030)

## 2016-01-07 LAB — POC URINE PREG, ED: Preg Test, Ur: NEGATIVE

## 2016-01-07 NOTE — ED Provider Notes (Signed)
[facility] Provider Note   CSN: [id number] Arrival date & time: 01/07/16  2246  First Provider Contact:  First MD Initiated Contact with Patient 01/07/16 2356     By signing my name below, I, Donna Knapp, attest that this documentation has been prepared under the direction and in the presence of Tomasita Crumble, MD . Electronically Signed: Freida Knapp, Scribe. 01/08/2016. 1:54 AM.  History   Chief Complaint Chief Complaint  Patient presents with  . Edema     The history is provided by the patient and the spouse. No language interpreter was used.    HPI Comments:  Donna Knapp is a 35 y.o. female who presents to the Emergency Department complaining of constant, moderate swelling to her bilateral feet and ankles x 1 week. She denies pain in her lower extremities. She also denies pain and swelling in the BUE, SOB, and recent fall/ injury. Pt notes she stands for long periods of time at work. No alleviating factors noted.  Past Medical History:  Diagnosis Date  . Gestational diabetes     Patient Active Problem List   Diagnosis Date Noted  . Cephalopelvic disproportion, delivered, current hospitalization 03/21/2013  . LGA (large for gestational age) fetus affecting mother, antepartum 03/16/2013  . Large for dates complicating pregnancy, antepartum 02/12/2013  . Language barrier, speaks Amharic ([place] only 02/09/2013  . Gestational diabetes mellitus, antepartum 01/22/2013  . Gestational edema in second trimester 11/12/2012  . Prenatal care insufficient 10/15/2012    Past Surgical History:  Procedure Laterality Date  . CESAREAN SECTION N/A 03/21/2013   Procedure: Primary Cesarean Section Delivery Baby Boy @ 0518, Apgars 8/9;  Surgeon: Tilda Burrow, MD;  Location: [facility];  Service: Obstetrics;  Laterality: N/A;    OB History    Gravida Para Term Preterm AB Living   0 1 5   SAB TAB Ectopic Multiple Live Births   1 0 0 0 2       Home  Medications    Prior to Admission medications   Not on File    Family History No family history on file.  Social History Social History  Substance Use Topics  . Smoking status: Never Smoker  . Smokeless tobacco: Never Used  . Alcohol use No     Allergies   Review of patient's allergies indicates no known allergies.   Review of Systems Review of Systems 10 systems reviewed and all are negative for acute change except as noted in the HPI.  Physical Exam Updated Vital Signs BP 104/60   Pulse 85   Temp 98.5 F (36.9 C) (Oral)   Resp 16   Ht 5' (1.524 m)   Wt 193 lb 9 oz (87.8 kg)   LMP 12/17/2015   SpO2 98%   BMI 37.80 kg/m   Physical Exam  Constitutional: She is oriented to person, place, and time. She appears well-developed and well-nourished. No distress.  HENT:  Head: Normocephalic and atraumatic.  Nose: Nose normal.  Mouth/Throat: Oropharynx is clear and moist. No oropharyngeal exudate.  Eyes: Conjunctivae and EOM are normal. Pupils are equal, round, and reactive to light. No scleral icterus.  Neck: Normal range of motion. Neck supple. No JVD present. No tracheal deviation present. No thyromegaly present.  Cardiovascular: Normal rate, regular rhythm and normal heart sounds.  Exam reveals no gallop and no friction rub.   No murmur heard. Pulmonary/Chest: Effort normal and breath sounds normal. No respiratory distress. She has no wheezes.  She exhibits no tenderness.  Abdominal: Soft. Bowel sounds are normal. She exhibits no distension and no mass. There is no tenderness. There is no rebound and no guarding.  Musculoskeletal: Normal range of motion. She exhibits edema (BLE). She exhibits no tenderness.  Lymphadenopathy:    She has no cervical adenopathy.  Neurological: She is alert and oriented to person, place, and time. No cranial nerve deficit. She exhibits normal muscle tone.  Skin: Skin is warm and dry. No rash noted. No erythema. No pallor.  Nursing note  and vitals reviewed.    ED Treatments / Results  DIAGNOSTIC STUDIES:  Oxygen Saturation is 98% on RA, normal by my interpretation.    COORDINATION OF CARE:  12:03 AM Discussed treatment plan with pt at bedside and pt agreed to plan.  Labs (all labs ordered are listed, but only abnormal results are displayed) Labs Reviewed  CBC WITH DIFFERENTIAL/PLATELET - Abnormal; Notable for the following:       Result Value   HCT 35.7 (*)    Eosinophils Absolute 0.9 (*)    All other components within normal limits  COMPREHENSIVE METABOLIC PANEL - Abnormal; Notable for the following:    Glucose, Bld 115 (*)    All other components within normal limits  URINALYSIS, ROUTINE W REFLEX MICROSCOPIC (NOT AT [facility]) - Abnormal; Notable for the following:    APPearance CLOUDY (*)    All other components within normal limits  BRAIN NATRIURETIC PEPTIDE  POC URINE PREG, ED    EKG  EKG Interpretation None       Radiology No results found.  Procedures Procedures   Medications Ordered in ED Medications  furosemide (LASIX) tablet 40 mg (40 mg Oral Given 01/08/16 0031)     Initial Impression / Assessment and Plan / ED Course  I have reviewed the triage vital signs and the nursing notes.  Pertinent labs & imaging results that were available during my care of the patient were reviewed by me and considered in my medical decision making (see chart for details).  Clinical Course    Patient presents to the emergency department for swelling in her legs for the past 2 weeks. This is likely due to her being on her feet for several long each day. She is advised on keeping her legs elevated at home. Liver, kidney, and heart function are all normal and laboratories testing. Will DC with 5 days of Lasix and primary care follow-up.  Final Clinical Impressions(s) / ED Diagnoses   Final diagnoses:  Peripheral edema    New Prescriptions New Prescriptions   No medications on file      I personally  performed the services described in this documentation, which was scribed in my presence. The recorded information has been reviewed and is accurate.       Tomasita Crumble, MD 01/08/16 [phone]

## 2016-01-07 NOTE — ED Triage Notes (Signed)
Pt c/o bil feet and ankle swelling x's 1 week.  Pt c/o urinary frequency.

## 2016-01-08 DIAGNOSIS — R600 Localized edema: Secondary | ICD-10-CM | POA: Diagnosis not present

## 2016-01-08 LAB — BRAIN NATRIURETIC PEPTIDE: B Natriuretic Peptide: 5.9 pg/mL (ref 0.0–100.0)

## 2016-01-08 MED ORDER — FUROSEMIDE 20 MG PO TABS
40.0000 mg | ORAL_TABLET | Freq: Once | ORAL | Status: AC
  Administered 2016-01-08: 40 mg via ORAL
  Filled 2016-01-08: qty 2

## 2016-01-08 MED ORDER — FUROSEMIDE 40 MG PO TABS: 40.0000 mg | ORAL_TABLET | Freq: Every day | ORAL | 0 refills | Status: DC

## 2016-01-08 NOTE — ED Notes (Signed)
Pt and spouse provided with d/c instructions at this time. Pt and spouse verbalizes understanding of d/c instructions as well as follow up procedure after d/c. Pt provided with RX for lasix. Pt and spouse verbalizes understanding of RX directions. Pt in no apparent distress at this time.  Pt ambulatory at time of d/c.

## 2016-01-08 NOTE — ED Notes (Signed)
Contacted lab about adding pt's BNP to be run on specimens already in the lab.

## 2016-04-12 ENCOUNTER — Ambulatory Visit: Payer: BLUE CROSS/BLUE SHIELD | Admitting: Certified Nurse Midwife

## 2016-04-16 ENCOUNTER — Ambulatory Visit (INDEPENDENT_AMBULATORY_CARE_PROVIDER_SITE_OTHER): Payer: BLUE CROSS/BLUE SHIELD | Admitting: Obstetrics and Gynecology

## 2016-04-16 ENCOUNTER — Encounter: Payer: Self-pay | Admitting: *Deleted

## 2016-04-16 VITALS — BP 107/72 | HR 103 | Wt 194.0 lb

## 2016-04-16 DIAGNOSIS — N760 Acute vaginitis: Secondary | ICD-10-CM | POA: Diagnosis not present

## 2016-04-16 NOTE — Progress Notes (Signed)
35 yo G6P5 presenting today for evaluation of 2-week history of vaginal pruritis and white discharge. Patient denies any odor. She denies any abdominal pain. She reports some relief in her symptoms after applying vaseline ointment to her vulva. She desires to be screened for diabetes.  Past Medical History:  Diagnosis Date  . Gestational diabetes    Past Surgical History:  Procedure Laterality Date  . CESAREAN SECTION N/A 03/21/2013   Procedure: Primary Cesarean Section Delivery Baby Boy @ 0518, Apgars 8/9;  Surgeon: Tilda BurrowJohn V Ferguson, MD;  Location: [facility];  Service: Obstetrics;  Laterality: N/A;   No family history on file. Social History  Substance Use Topics  . Smoking status: Never Smoker  . Smokeless tobacco: Never Used  . Alcohol use No   ROS See pertinent in HPI  Blood pressure 107/72, pulse (!) 103, weight 194 lb (88 kg), last menstrual period 04/02/2016, currently breastfeeding. GENERAL: Well-developed, well-nourished female in no acute distress.  ABDOMEN: Soft, nontender, nondistended. Obese PELVIC: Normal external female genitalia. Vagina is pink and rugated.  Normal discharge. Normal appearing cervix. Uterus is normal in size. No adnexal mass or tenderness. EXTREMITIES: No cyanosis, clubbing, or edema, 2+ distal pulses.  A/P 35 yo here with vaginitis - Nususwab collected - HgA1c collected - patient will be contacted with any abnormal results - RTC in January for annual exam

## 2016-04-17 LAB — HEMOGLOBIN A1C
ESTIMATED AVERAGE GLUCOSE: 100 mg/dL
Hgb A1c MFr Bld: 5.1 % (ref 4.8–5.6)

## 2016-04-19 LAB — NUSWAB VG, CANDIDA 6SP
CANDIDA LUSITANIAE, NAA: NEGATIVE
CANDIDA PARAPSILOSIS, NAA: NEGATIVE
Candida albicans, NAA: NEGATIVE
Candida glabrata, NAA: NEGATIVE
Candida krusei, NAA: NEGATIVE
Candida tropicalis, NAA: NEGATIVE
Trich vag by NAA: NEGATIVE

## 2016-04-20 ENCOUNTER — Telehealth: Payer: Self-pay | Admitting: *Deleted

## 2016-04-20 NOTE — Telephone Encounter (Signed)
-----   Message from Catalina AntiguaPeggy Constant, MD sent at 04/19/2016 10:32 AM EST ----- Please inform patient of negative screen for diabetes and no sign of vaginal infection. Discharge is normal. Please review perineal hygiene which includes washing twice daily with a mild, non scented soap. Avoid douching.  I am happy to see her for her annual exam after 06/29/2016. Please ensure that a full 30 minutes is allocated for her appointment as she needs an interpreter and there seem to be a lack of general understanding

## 2016-04-20 NOTE — Telephone Encounter (Signed)
Voice mail is not set up.
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
## 2018-12-27 ENCOUNTER — Other Ambulatory Visit: Payer: Self-pay

## 2018-12-27 ENCOUNTER — Ambulatory Visit (HOSPITAL_COMMUNITY)
Admission: EM | Admit: 2018-12-27 | Discharge: 2018-12-27 | Disposition: A | Discharge status: Home / Self Care | Payer: BC Managed Care – PPO | Attending: Emergency Medicine | Admitting: Emergency Medicine

## 2018-12-27 ENCOUNTER — Encounter (HOSPITAL_COMMUNITY): Payer: Self-pay | Admitting: Emergency Medicine

## 2018-12-27 DIAGNOSIS — R51 Headache: Secondary | ICD-10-CM | POA: Diagnosis not present

## 2018-12-27 DIAGNOSIS — M25561 Pain in right knee: Secondary | ICD-10-CM

## 2018-12-27 DIAGNOSIS — R609 Edema, unspecified: Secondary | ICD-10-CM | POA: Diagnosis present

## 2018-12-27 DIAGNOSIS — R600 Localized edema: Secondary | ICD-10-CM

## 2018-12-27 DIAGNOSIS — Z20828 Contact with and (suspected) exposure to other viral communicable diseases: Secondary | ICD-10-CM | POA: Insufficient documentation

## 2018-12-27 DIAGNOSIS — M25562 Pain in left knee: Secondary | ICD-10-CM | POA: Insufficient documentation

## 2018-12-27 MED ORDER — IBUPROFEN 400 MG PO TABS: 400.0000 mg | ORAL_TABLET | Freq: Four times a day (QID) | ORAL | 0 refills | Status: AC | PRN

## 2018-12-27 NOTE — ED Triage Notes (Signed)
Symptoms x 2 days: general body aches, headache, hot flashes.  No fever. No sob No cough  Patient feels tired

## 2018-12-27 NOTE — ED Provider Notes (Signed)
[facility]    CSN: [id number] Arrival date & time: 12/27/18  1706     History   Chief Complaint Chief Complaint  Patient presents with  . Headache    HPI Donna Knapp is a 38 y.o. female.   Donna Knapp presents with complaints of body aches. Leg pain. She works at [employer] with [employer]. More pain with working, fatigue. This started two days ago. Pain is better today. Walking worsened the leg pain. Occasionally lower leg and feet swelling. Also with headache, which was 7/20. No longer with headache. Hasn't taken any medications for symptoms. No cough, congestion or sore throat. No fevers. No injury. No gi symptoms.   Per chart review has been on lasix in the past. Currently not on any medications and doesn't follow with a PCP.    Family member present to provide interpretation.    ROS per HPI, negative if not otherwise mentioned.      Past Medical History:  Diagnosis Date  . Gestational diabetes     Patient Active Problem List   Diagnosis Date Noted  . Cephalopelvic disproportion, delivered, current hospitalization 03/21/2013  . LGA (large for gestational age) fetus affecting mother, antepartum 03/16/2013  . Large for dates complicating pregnancy, antepartum 02/12/2013  . Language barrier, speaks Amharic [place] only 02/09/2013  . Gestational diabetes mellitus, antepartum 01/22/2013  . Gestational edema in second trimester 11/12/2012  . Prenatal care insufficient 10/15/2012    Past Surgical History:  Procedure Laterality Date  . CESAREAN SECTION N/A 03/21/2013   Procedure: Primary Cesarean Section Delivery Baby Boy @ 0518, Apgars 8/9;  Surgeon: Tilda BurrowJohn V Ferguson, MD;  Location: [facility];  Service: Obstetrics;  Laterality: N/A;    OB History    Gravida  6   Para  5   Term  5   Preterm  0   AB  1   Living  5     SAB  1   TAB  0   Ectopic  0   Multiple  0   Live Births  2            Home Medications    Prior  to Admission medications   Medication Sig Start Date End Date Taking? Authorizing Provider  furosemide (LASIX) 40 MG tablet Take 1 tablet (40 mg total) by mouth daily. Patient not taking: Reported on 04/16/2016 01/08/16   Tomasita Crumbleni, Adeleke, MD  ibuprofen (ADVIL) 400 MG tablet Take 1 tablet (400 mg total) by mouth every 6 (six) hours as needed for mild pain or moderate pain. 12/27/18   Georgetta HaberBurky,  B, NP    Family History History reviewed. No pertinent family history.  Social History Social History   Tobacco Use  . Smoking status: Never Smoker  . Smokeless tobacco: Never Used  Substance Use Topics  . Alcohol use: No    Alcohol/week: 0.0 standard drinks  . Drug use: No     Allergies   Patient has no known allergies.   Review of Systems Review of Systems   Physical Exam Triage Vital Signs ED Triage Vitals  Enc Vitals Group     BP 12/27/18 1811 (!) 109/40     Pulse Rate 12/27/18 1811 87     Resp 12/27/18 1811 18     Temp 12/27/18 1811 98.6 F (37 C)     Temp Source 12/27/18 1811 Oral     SpO2 12/27/18 1811 99 %     Weight --  Height --      Head Circumference --      Peak Flow --      Pain Score 12/27/18 1805 6     Pain Loc --      Pain Edu? --      Excl. in [place]? --    No data found.  Updated Vital Signs BP (!) 109/40 (BP Location: Left Arm)   Pulse 87   Temp 98.6 F (37 C) (Oral)   Resp 18   LMP 12/14/2018   SpO2 99%    Physical Exam Constitutional:      General: She is not in acute distress.    Appearance: She is well-developed.  Cardiovascular:     Rate and Rhythm: Normal rate.     Heart sounds: Normal heart sounds.  Pulmonary:     Effort: Pulmonary effort is normal.  Musculoskeletal:     Comments: Bilateral lower extremities with trace non pitting edema; no redness, non tender; full ROM and ambulatory without difficulty  Skin:    General: Skin is warm and dry.  Neurological:     Mental Status: She is alert and oriented to person, place, and  time.      UC Treatments / Results  Labs (all labs ordered are listed, but only abnormal results are displayed) Labs Reviewed  NOVEL CORONAVIRUS, NAA (HOSPITAL ORDER, SEND-OUT TO REF LAB)    EKG   Radiology No results found.  Procedures Procedures (including critical care time)  Medications Ordered in UC Medications - No data to display  Initial Impression / Assessment and Plan / UC Course  I have reviewed the triage vital signs and the nursing notes.  Pertinent labs & imaging results that were available during my care of the patient were reviewed by me and considered in my medical decision making (see chart for details).     Vitals stable. Afebrile. Symptoms appear related to new position at work. Supportive cares recommended at this time- compression, elevation, low sodium diet. covid testing collected on arrival due to body aches and headache as initially described. With further evaluation seems less likely infectious. Encouraged follow up with PCP for recheck. Patient verbalized understanding and agreeable to plan.  Ambulatory out of clinic without difficulty.   Final Clinical Impressions(s) / UC Diagnoses   Final diagnoses:  Arthralgia of both lower legs  Peripheral edema     Discharge Instructions     Use of compression stockings to help with swelling while on your feet at work.  Elevate legs once done with work.  Limit salt in diet.  Ibuprofen as needed for pain, take with food.  Please follow up with your primary care provider for any persistent symptoms.     ED Prescriptions    Medication Sig Dispense Auth. Provider   ibuprofen (ADVIL) 400 MG tablet Take 1 tablet (400 mg total) by mouth every 6 (six) hours as needed for mild pain or moderate pain. 30 tablet Zigmund Gottron, NP     Controlled Substance Prescriptions [place] Controlled Substance Registry consulted? Not Applicable   Zigmund Gottron, NP 12/27/18 1843

## 2018-12-27 NOTE — Discharge Instructions (Signed)
Use of compression stockings to help with swelling while on your feet at work.  Elevate legs once done with work.  Limit salt in diet.  Ibuprofen as needed for pain, take with food.  Please follow up with your primary care provider for any persistent symptoms.

## 2018-12-30 LAB — NOVEL CORONAVIRUS, NAA (HOSP ORDER, SEND-OUT TO REF LAB; TAT 18-24 HRS): SARS-CoV-2, NAA: NOT DETECTED

## 2020-02-22 ENCOUNTER — Encounter (HOSPITAL_COMMUNITY): Payer: Self-pay | Admitting: *Deleted

## 2020-02-22 ENCOUNTER — Emergency Department (HOSPITAL_COMMUNITY)
Admission: EM | Admit: 2020-02-22 | Discharge: 2020-02-25 | Payer: BC Managed Care – PPO | Attending: Emergency Medicine | Admitting: Emergency Medicine

## 2020-02-22 ENCOUNTER — Other Ambulatory Visit: Payer: Self-pay

## 2020-02-22 DIAGNOSIS — R456 Violent behavior: Secondary | ICD-10-CM | POA: Diagnosis not present

## 2020-02-22 DIAGNOSIS — R451 Restlessness and agitation: Secondary | ICD-10-CM | POA: Insufficient documentation

## 2020-02-22 DIAGNOSIS — Z20822 Contact with and (suspected) exposure to covid-19: Secondary | ICD-10-CM | POA: Insufficient documentation

## 2020-02-22 DIAGNOSIS — Z01818 Encounter for other preprocedural examination: Secondary | ICD-10-CM | POA: Diagnosis present

## 2020-02-22 DIAGNOSIS — R4689 Other symptoms and signs involving appearance and behavior: Secondary | ICD-10-CM

## 2020-02-22 LAB — COMPREHENSIVE METABOLIC PANEL
ALT: 23 U/L (ref 0–44)
AST: 22 U/L (ref 15–41)
Albumin: 3.8 g/dL (ref 3.5–5.0)
Alkaline Phosphatase: 53 U/L (ref 38–126)
Anion gap: 11 (ref 5–15)
BUN: 7 mg/dL (ref 6–20)
CO2: 20 mmol/L — ABNORMAL LOW (ref 22–32)
Calcium: 9.1 mg/dL (ref 8.9–10.3)
Chloride: 106 mmol/L (ref 98–111)
Creatinine, Ser: 0.77 mg/dL (ref 0.44–1.00)
GFR calc Af Amer: >60 mL/min (ref >60)
GFR calc non Af Amer: >60 mL/min (ref >60)
Glucose, Bld: 107 mg/dL — ABNORMAL HIGH (ref 70–99)
Potassium: 3.8 mmol/L (ref 3.5–5.1)
Sodium: 137 mmol/L (ref 135–145)
Total Bilirubin: 0.9 mg/dL (ref 0.3–1.2)
Total Protein: 8 g/dL (ref 6.5–8.1)

## 2020-02-22 LAB — CBC
HCT: 35.3 % — ABNORMAL LOW (ref 36.0–46.0)
Hemoglobin: 11.4 g/dL — ABNORMAL LOW (ref 12.0–15.0)
MCH: 25 pg — ABNORMAL LOW (ref 26.0–34.0)
MCHC: 32.3 g/dL (ref 30.0–36.0)
MCV: 77.4 fL — ABNORMAL LOW (ref 80.0–100.0)
Platelets: 327 10*3/uL (ref 150–400)
RBC: 4.56 MIL/uL (ref 3.87–5.11)
RDW: 14.2 % (ref 11.5–15.5)
WBC: 9.6 10*3/uL (ref 4.0–10.5)
nRBC: 0 % (ref 0.0–0.2)

## 2020-02-22 LAB — SALICYLATE LEVEL: Salicylate Lvl: <7 mg/dL — ABNORMAL LOW (ref 7.0–30.0)

## 2020-02-22 LAB — ETHANOL: Alcohol, Ethyl (B): <10 mg/dL (ref <10)

## 2020-02-22 LAB — I-STAT BETA HCG BLOOD, ED (MC, WL, AP ONLY): I-stat hCG, quantitative: <5 m[IU]/mL (ref <5)

## 2020-02-22 LAB — ACETAMINOPHEN LEVEL: Acetaminophen (Tylenol), Serum: <10 ug/mL — ABNORMAL LOW (ref 10–30)
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
## 2020-02-23 LAB — SARS CORONAVIRUS 2 BY RT PCR (HOSPITAL ORDER, PERFORMED IN ~~LOC~~ HOSPITAL LAB): SARS Coronavirus 2: NEGATIVE

## 2020-02-23 LAB — RAPID URINE DRUG SCREEN, HOSP PERFORMED
Amphetamines: NOT DETECTED
Barbiturates: NOT DETECTED
Benzodiazepines: NOT DETECTED
Cocaine: NOT DETECTED
Opiates: NOT DETECTED
Tetrahydrocannabinol: NOT DETECTED

## 2020-02-23 MED ORDER — ZIPRASIDONE MESYLATE 20 MG IM SOLR
20.0000 mg | Freq: Once | INTRAMUSCULAR | Status: DC
  Filled 2020-02-23: qty 20

## 2020-02-23 MED ORDER — HALOPERIDOL LACTATE 5 MG/ML IJ SOLN
5.0000 mg | Freq: Once | INTRAMUSCULAR | Status: AC
Start: 2020-02-23 — End: 2020-02-23
  Administered 2020-02-23: 5 mg via INTRAMUSCULAR
  Filled 2020-02-23: qty 1

## 2020-02-23 MED ORDER — OLANZAPINE 5 MG PO TABS
5.0000 mg | ORAL_TABLET | Freq: Two times a day (BID) | ORAL | Status: DC
  Administered 2020-02-23 – 2020-02-25 (×3): 5 mg via ORAL
  Filled 2020-02-23 (×5): qty 1

## 2020-02-23 MED ORDER — LORAZEPAM 2 MG/ML IJ SOLN
2.0000 mg | Freq: Once | INTRAMUSCULAR | Status: AC
  Administered 2020-02-23: 2 mg via INTRAMUSCULAR
  Filled 2020-02-23: qty 1

## 2020-02-23 MED ORDER — OLANZAPINE 10 MG IM SOLR
5.0000 mg | Freq: Once | INTRAMUSCULAR | Status: DC
  Filled 2020-02-23: qty 10

## 2020-02-23 MED ORDER — ZIPRASIDONE MESYLATE 20 MG IM SOLR
20.0000 mg | Freq: Once | INTRAMUSCULAR | Status: AC
  Administered 2020-02-23: 20 mg via INTRAMUSCULAR

## 2020-02-23 NOTE — ED Notes (Signed)
Using ipad translator pt assessed by this RN. Pt denies si/hi, denies visual hallucinations, endorses auditory hallucinations. She states she is not hearing voices at this time, but was before. When asked what the voices were saying patient states "nothing". Bathroom opportunity offered to patient who states she does need to go. I explained to the patient that she is here under IVC for the incidents that occurred yesterday and her behavior throughout the night and attempting to run resulted in her being medicated and placed in violent restraints. I explained to patient that if she becomes aggressive or tries to run again, the result will be the same. Pt verbalizes understanding.

## 2020-02-23 NOTE — ED Provider Notes (Signed)
[facility] EMERGENCY DEPARTMENT Provider Note  CSN: [id number] Arrival date & time: 02/22/20 1208  Chief Complaint(s) Medical Clearance  HPI Donna Knapp is a 39 y.o. female who presents to the emergency department after being IVC by her daughter.  IVC papers state:  Media Information   Document Information  Photos  IVC paperwork   02/23/2020 01:08  Attached To:  Hospital Encounter on 02/22/20  Source Information  Weston Fulco, Amadeo Garnet, MD  [facility]    Patient is agitated, screaming, and uncooperative. She requires 3-4 individual to restrain her.  I attempted to talk to the patient with the interpreter however patient kept stating that her rights were not being respected.  She refused to provide any specific details regarding her visit to the emergency department.  She states that she knows why she is here but will not talk.  Remainder of history, ROS, and physical exam limited due to patient's condition (psychiatric disorder).  Level V Caveat.  She ultimately required chemical restraints for her and our staff's safety.  HPI  Past Medical History Past Medical History:  Diagnosis Date  . Gestational diabetes    Patient Active Problem List   Diagnosis Date Noted  . Cephalopelvic disproportion, delivered, current hospitalization 03/21/2013  . LGA (large for gestational age) fetus affecting mother, antepartum 03/16/2013  . Large for dates complicating pregnancy, antepartum 02/12/2013  . Language barrier, speaks Amharic ([place]) only 02/09/2013  . Gestational diabetes mellitus, antepartum 01/22/2013  . Gestational edema in second trimester 11/12/2012  . Prenatal care insufficient 10/15/2012   Home Medication(s) Prior to Admission medications   Medication Sig Start Date End Date Taking? Authorizing Provider  furosemide (LASIX) 40 MG tablet Take 1 tablet (40 mg total) by mouth daily. Patient not taking: Reported on 04/16/2016  01/08/16   Tomasita Crumble, MD  ibuprofen (ADVIL) 400 MG tablet Take 1 tablet (400 mg total) by mouth every 6 (six) hours as needed for mild pain or moderate pain. 12/27/18   Georgetta Haber, NP                                                                                                                                    Past Surgical History Past Surgical History:  Procedure Laterality Date  . CESAREAN SECTION N/A 03/21/2013   Procedure: Primary Cesarean Section Delivery Baby Boy @ 0518, Apgars 8/9;  Surgeon: Tilda Burrow, MD;  Location: [facility];  Service: Obstetrics;  Laterality: N/A;   Family History History reviewed. No pertinent family history.  Social History Social History   Tobacco Use  . Smoking status: Never Smoker  . Smokeless tobacco: Never Used  Substance Use Topics  . Alcohol use: No    Alcohol/week: 0.0 standard drinks  . Drug use: No   Allergies Patient has no known allergies.  Review of Systems Review of Systems  Unable to perform ROS: Psychiatric disorder  Physical Exam Vital Signs  I have reviewed the triage vital signs BP 127/71 (BP Location: Right Arm)   Pulse 83   Temp 97.8 F (36.6 C) (Oral)   Resp 16   SpO2 100%   Physical Exam Vitals reviewed.  Constitutional:      General: She is not in acute distress.    Appearance: She is well-developed. She is not diaphoretic.  HENT:     Head: Normocephalic and atraumatic.     Right Ear: External ear normal.     Left Ear: External ear normal.     Nose: Nose normal.  Eyes:     General: No scleral icterus.    Conjunctiva/sclera: Conjunctivae normal.  Neck:     Trachea: Phonation normal.  Cardiovascular:     Rate and Rhythm: Normal rate and regular rhythm.  Pulmonary:     Effort: Pulmonary effort is normal. No respiratory distress.     Breath sounds: No stridor.  Abdominal:     General: There is no distension.  Musculoskeletal:        General: Normal range of motion.     Cervical back:  Normal range of motion.  Neurological:     Mental Status: She is alert and oriented to person, place, and time.  Psychiatric:        Attention and Perception: She is inattentive.        Mood and Affect: Affect is angry.        Speech: Speech is rapid and pressured.        Behavior: Behavior is agitated, aggressive and hyperactive.     ED Results and Treatments Labs (all labs ordered are listed, but only abnormal results are displayed) Labs Reviewed  COMPREHENSIVE METABOLIC PANEL - Abnormal; Notable for the following components:      Result Value   CO2 20 (*)    Glucose, Bld 107 (*)    All other components within normal limits  SALICYLATE LEVEL - Abnormal; Notable for the following components:   Salicylate Lvl <7.0 (*)    All other components within normal limits  ACETAMINOPHEN LEVEL - Abnormal; Notable for the following components:   Acetaminophen (Tylenol), Serum <10 (*)    All other components within normal limits  CBC - Abnormal; Notable for the following components:   Hemoglobin 11.4 (*)    HCT 35.3 (*)    MCV 77.4 (*)    MCH 25.0 (*)    All other components within normal limits  ETHANOL  RAPID URINE DRUG SCREEN, HOSP PERFORMED  I-STAT BETA HCG BLOOD, ED ([facility], [facility], [facility] ONLY)                                                                                                                         EKG  EKG Interpretation  Date/Time:    Ventricular Rate:    PR Interval:    QRS Duration:   QT Interval:    QTC Calculation:   R Axis:  Text Interpretation:        Radiology No results found.  Pertinent labs & imaging results that were available during my care of the patient were reviewed by me and considered in my medical decision making (see chart for details).  Medications Ordered in ED Medications  ziprasidone (GEODON) injection 20 mg (20 mg Intramuscular Given 02/23/20 0030)                                                                                                                                     Procedures .Critical Care Performed by: Nira Conn, MD Authorized by: Nira Conn, MD    CRITICAL CARE Performed by: Amadeo Garnet Juda Lajeunesse Total critical care time: 35 minutes Critical care time was exclusive of separately billable procedures and treating other patients. Critical care was necessary to treat or prevent imminent or life-threatening deterioration. Critical care was time spent personally by me on the following activities: development of treatment plan with patient and/or surrogate as well as nursing, discussions with consultants, evaluation of patient's response to treatment, examination of patient, obtaining history from patient or surrogate, ordering and performing treatments and interventions, ordering and review of laboratory studies, ordering and review of radiographic studies, pulse oximetry and re-evaluation of patient's condition.   (including critical care time)  Medical Decision Making / ED Course I have reviewed the nursing notes for this encounter and the patient's prior records (if available in EHR or on provided paperwork).   Donna Knapp was evaluated in Emergency Department on 02/23/2020 for the symptoms described in the history of present illness. She was evaluated in the context of the global COVID-19 pandemic, which necessitated consideration that the patient might be at risk for infection with the SARS-CoV-2 virus that causes COVID-19. Institutional protocols and algorithms that pertain to the evaluation of patients at risk for COVID-19 are in a state of rapid change based on information released by regulatory bodies including the CDC and federal and state organizations. These policies and algorithms were followed during the patient's care in the ED.  Patient is IVC for agitated/extreme behavior at home with aggressive behavior towards her husband.  Patient here initially was cooperative  however she became agitated and required chemical restraint.   During her wait, screening labs obtained and were grossly reassuring.  Patient is medically cleared for behavioral health evaluation once able.      Final Clinical Impression(s) / ED Diagnoses Final diagnoses:  Agitation  Aggressive behavior      This chart was dictated using voice recognition software.  Despite best efforts to proofread,  errors can occur which can change the documentation meaning.   Nira Conn, MD 02/23/20 [phone]

## 2020-02-23 NOTE — ED Notes (Signed)
PT returned to room after using bathroom with no incidents. Sitter at bedside and pt eating at this time.

## 2020-02-23 NOTE — ED Notes (Signed)
Pt arrived to room with security & traige nurse. MD Cardama at bedside to assess pt.

## 2020-02-23 NOTE — Progress Notes (Signed)
Patient ID: Donna Knapp, female   DOB: Jun 15, 1980, 39 y.o.   MRN: [id number]  Medication Recommendation   Patient presents with psychosis and agitation/agressive beahvior.  Her UDS is negative and Ethanol level in normal range. Following review of chart, Zyprexa 5 mg po BID recommended by Dr. Lucianne Muss. Order will be placed along with EKG for baseline and to evaluate QTc.

## 2020-02-23 NOTE — ED Notes (Signed)
Pt resting comfortably, respirations even & unlabored. NAD noted

## 2020-02-24 MED ORDER — STERILE WATER FOR INJECTION IJ SOLN
INTRAMUSCULAR | Status: AC
  Filled 2020-02-24: qty 10

## 2020-02-24 MED ORDER — ZIPRASIDONE MESYLATE 20 MG IM SOLR
20.0000 mg | Freq: Once | INTRAMUSCULAR | Status: DC | PRN
  Filled 2020-02-24 (×3): qty 20

## 2020-02-24 NOTE — ED Notes (Addendum)
Pt has not been eating hospital food. Husband brought African food from home and she ate it. He reports she had similar episode 13 years ago when in [place] and she got better with "western medicine". But since then, has been fine.

## 2020-02-24 NOTE — ED Notes (Signed)
TTS in progress 

## 2020-02-24 NOTE — ED Notes (Signed)
TTS attempted, pt would not communicate with psychologist via interpreter, pt understood interpreters questions but continued to tell sitter she did not understand. Pt understood instructions prior to interview from same interpreter. Pt attempting to leave, attempt stopped by RNx2 and sitter standing in doorway. Security called and responded. Attempted to explain to patient she is unable to leave due to IVC. Pt shaking head. MD notified, PRN orders obtained. Pt back in bed laying down, sitter at bedside.

## 2020-02-24 NOTE — ED Provider Notes (Signed)
Emergency Medicine Observation Re-evaluation Note  Donna Knapp is a 39 y.o. female, seen on rounds today.  Pt initially presented to the ED for complaints of Medical Clearance Currently, the patient is awaiting psychiatric placement.  Physical Exam  BP (!) 102/56 (BP Location: Left Arm)   Pulse 96   Temp 99 F (37.2 C) (Oral)   Resp 18   SpO2 100%  Physical Exam General: no acute distress, sleeping Lungs: no increased WOB  ED Course / MDM  EKG:EKG Interpretation  Date/Time:  Tuesday February 23 2020 19:24:29 EDT Ventricular Rate:  110 PR Interval:    QRS Duration: 91 QT Interval:  333 QTC Calculation: 451 R Axis:   46 Text Interpretation: Sinus tachycardia Baseline wander in lead(s) V4 Otherwise within normal limits No old tracing to compare Confirmed by Dione Booze ([id number]) on 02/24/2020 1:08:44 AM    I have reviewed the labs performed to date as well as medications administered while in observation.  Recent changes in the last 24 hours include 20 mg geodon IM at 1:30 AM.  Plan  Current plan is for psychiatric admission. Patient is under full IVC at this time.   Pricilla Loveless, MD 02/24/20 1058

## 2020-02-24 NOTE — ED Notes (Signed)
karima- dtr's number can get in touch with her husband. [phone]

## 2020-02-24 NOTE — ED Notes (Signed)
1:1 sitter at bedside. Pt laying supine in stretcher with eyes closed. Resp e/u at 14

## 2020-02-24 NOTE — Progress Notes (Addendum)
Pt meets inpatient criteria per Nira Conn, NP. Referral information has been sent to the following hospitals for review:   [facility]  [facility]  [facility]  [facility]     Disposition will continue to assist with inpatient placement needs.   Wells Guiles, MSW, LCSW, LCAS Clinical Social Worker II Disposition CSW [phone]

## 2020-02-24 NOTE — Progress Notes (Signed)
Patient assigned bed 507-1 at [facility]. Please call report to [phone] before transport. Patient may arrive at [facility] after 1500. If you have any questions, please call the [facility] at [phone]. Thank you.

## 2020-02-24 NOTE — Progress Notes (Signed)
Pt accepted to [facility]    Dr Estill Cotta is the accepting/attending provider   Call report to [phone]  [facility] @ [facility] ED notified.     Pt is involuntary and will be transported by law enforcement   Pt is scheduled to arrive at [facility] on 02/25/20 after 11am.    Wells Guiles, MSW, LCSW, LCAS Clinical Social Worker II Disposition CSW [phone]
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
## 2020-02-25 MED ORDER — STERILE WATER FOR INJECTION IJ SOLN
INTRAMUSCULAR | Status: AC
  Filled 2020-02-25: qty 10

## 2020-02-25 MED ORDER — ZIPRASIDONE MESYLATE 20 MG IM SOLR
20.0000 mg | Freq: Once | INTRAMUSCULAR | Status: AC
  Administered 2020-02-25: 20 mg via INTRAMUSCULAR

## 2020-02-25 NOTE — ED Notes (Signed)
Soft restraints removed.  Pt sleeping.  No signs of distress or injury from soft restraints.

## 2020-02-25 NOTE — ED Notes (Addendum)
Pt became very agitated, saying she needed to use the bathroom.  Pt taken to bathroom however refused to use it and then attempted to run out the door to the waiting room.  Three RNs and security escorted pt back to room.  Was given an order for Geodon and soft restrains.  Restraints were placed and Geodon was administered.  Stimulus decreased and sitter is sitting in the room w/ her.

## 2020-02-25 NOTE — ED Notes (Signed)
Daughter called and aware of pt transfer to [place].

## 2020-02-25 NOTE — ED Notes (Signed)
Patient has a visitor also patient brought in food for patient

## 2020-02-25 NOTE — ED Notes (Signed)
Pt a little calmer, still continuing to object to being here however is quieter.  Sitter continues to be bedside.

## 2020-02-25 NOTE — ED Notes (Signed)
RN introduced self to pt and family member.  Pt did not want to interact w/ RN, would not answer any questions.  RN did answer all the family member's questions.  Pt did not appear to be in any distress at this time.  Family member was feeding pt her dinner.  Sitter bedside.
